# Patient Record
Sex: Female | Born: 1955 | Race: White | Hispanic: No | Marital: Married | Smoking: Never smoker
Health system: Southern US, Community
[De-identification: ages and names within clinical notes are randomized; demographics above are authoritative.]

## PROBLEM LIST (undated history)

## (undated) DIAGNOSIS — R519 Headache, unspecified: Secondary | ICD-10-CM

## (undated) DIAGNOSIS — K56609 Unspecified intestinal obstruction, unspecified as to partial versus complete obstruction: Secondary | ICD-10-CM

## (undated) DIAGNOSIS — Z789 Other specified health status: Secondary | ICD-10-CM

## (undated) HISTORY — DX: Headache, unspecified: R51.9

---

## 1978-11-06 HISTORY — PX: FOOT SURGERY: SHX648

## 1978-11-06 HISTORY — PX: RECONSTRUCTION OF NOSE: SHX2301

## 1998-06-18 ENCOUNTER — Ambulatory Visit (HOSPITAL_BASED_OUTPATIENT_CLINIC_OR_DEPARTMENT_OTHER): Admission: RE | Admit: 1998-06-18 | Discharge: 1998-06-18 | Payer: Self-pay | Admitting: Orthopedic Surgery

## 1998-06-23 ENCOUNTER — Other Ambulatory Visit: Admission: RE | Admit: 1998-06-23 | Discharge: 1998-06-23 | Payer: Self-pay | Admitting: Obstetrics and Gynecology

## 1999-06-23 ENCOUNTER — Other Ambulatory Visit: Admission: RE | Admit: 1999-06-23 | Discharge: 1999-06-23 | Payer: Self-pay | Admitting: Obstetrics and Gynecology

## 2001-07-01 ENCOUNTER — Other Ambulatory Visit: Admission: RE | Admit: 2001-07-01 | Discharge: 2001-07-01 | Payer: Self-pay | Admitting: Obstetrics and Gynecology

## 2001-11-30 ENCOUNTER — Encounter: Payer: Self-pay | Admitting: Surgery

## 2001-11-30 ENCOUNTER — Inpatient Hospital Stay (HOSPITAL_COMMUNITY): Admission: EM | Admit: 2001-11-30 | Discharge: 2001-12-07 | Payer: Self-pay | Admitting: Emergency Medicine

## 2001-12-01 ENCOUNTER — Encounter: Payer: Self-pay | Admitting: Surgery

## 2002-11-06 HISTORY — PX: ABDOMINAL ADHESION SURGERY: SHX90

## 2003-02-06 ENCOUNTER — Other Ambulatory Visit: Admission: RE | Admit: 2003-02-06 | Discharge: 2003-02-06 | Payer: Self-pay | Admitting: Obstetrics and Gynecology

## 2004-04-15 ENCOUNTER — Other Ambulatory Visit: Admission: RE | Admit: 2004-04-15 | Discharge: 2004-04-15 | Payer: Self-pay | Admitting: Obstetrics and Gynecology

## 2006-01-31 ENCOUNTER — Other Ambulatory Visit: Admission: RE | Admit: 2006-01-31 | Discharge: 2006-01-31 | Payer: Self-pay | Admitting: Obstetrics & Gynecology

## 2006-03-07 HISTORY — PX: COLONOSCOPY: SHX174

## 2006-03-07 LAB — HM COLONOSCOPY

## 2007-06-06 ENCOUNTER — Other Ambulatory Visit: Admission: RE | Admit: 2007-06-06 | Discharge: 2007-06-06 | Payer: Self-pay | Admitting: Obstetrics and Gynecology

## 2008-07-09 ENCOUNTER — Other Ambulatory Visit: Admission: RE | Admit: 2008-07-09 | Discharge: 2008-07-09 | Payer: Self-pay | Admitting: Obstetrics and Gynecology

## 2010-04-07 ENCOUNTER — Ambulatory Visit: Payer: Self-pay | Admitting: Physician Assistant

## 2010-04-11 ENCOUNTER — Ambulatory Visit: Payer: Self-pay | Admitting: Family Medicine

## 2010-04-21 ENCOUNTER — Ambulatory Visit: Payer: Self-pay | Admitting: Physician Assistant

## 2011-03-24 NOTE — Discharge Summary (Signed)
[facility]  Patient:    DAY, GREB Visit Number: [id number] MRN: [id number]          Service Type: SUR Location: 4W 0449 01 Attending Physician:  Charlton Haws Dictated by:   Currie Paris, M.D. Admit Date:  11/30/2001 Discharge Date: 12/07/2001                             Discharge Summary  VISIT #[id number]  FINAL DIAGNOSES: 1. Acute small-bowel obstruction. 2. Hypokalemia.  HOSPITAL COURSE:  Ms. Levings is a generally healthy 55 year old lady who was admitted on November 30, 2001, with what appeared to be an early small-bowel obstruction, but she was also a little bit hypokalemic.  The next morning a follow-up x-ray showed no improvement in continued pattern of small-bowel obstruction.  Her potassium was up to 3.2.  She was taken to the operating room on December 01, 2001, where a closed-loop small-bowel obstruction was found secondary to an adhesion from prior surgery.  However, there was no compromise of the bowel, and she underwent cholecystectomy.  Postoperatively she basically did well.  She had NG in for a couple of days and then was able to have it removed.  By the third postoperative day she was progressing, her staples were removed, and Dulcolax given.  On the fourth postoperative day she was advanced a little bit on her diet.  This was gradually advanced until December 07, 2001, when she was able to be discharged.  Her wound looked okay, but she had a little clear wound drainage.  There was no abscess encountered. She was discharged with Tylox for pain.  No strenuous activity.  See me in the office in about two weeks.  LABORATORY DATA:  Initial laboratory studies included a hemoglobin of 12, white count of 11,600.  Urine was basically clear, although she had some white cells and red cells on initial, and on repeat that was negative.  EKG showed a little left atrial enlargement but otherwise negative.  PA and lateral  chest x-ray were negative. Dictated by:   Currie Paris, M.D. Attending Physician:  Charlton Haws DD:  12/14/01 TD:  12/15/01 Job: [id number]

## 2011-03-24 NOTE — H&P (Signed)
[facility]  Patient:    Anne Kelly, Anne Kelly Visit Number: [id number] MRN: [id number]          Service Type: SUR Location: 4W 0449 01 Attending Physician:  Charlton Haws Dictated by:   Currie Paris, M.D. Admit Date:  11/30/2001   CC:         [facility]   History and Physical  VISIT NUMBER:  [id number]  CHIEF COMPLAINT:  Abdominal pain.  CLINICAL HISTORY:  Anne Kelly is a 55 year old lady who was in her usual state of good health until Wednesday before admission (today is Saturday) when she developed some abdominal pain and nausea.  She vomited one time.  She continued to have discomfort through her upper abdomen all day Thursday and Friday.  She really was not keeping anything down, trying just to do sips of liquids, but throwing up most of that as well.  She has not had a bowel movement nor passed gas for the last 48-72 hours either.  Because of her ongoing symptoms, she was seen in the [facility].  X-rays there suggested SBO and I was asked to see her in the emergency room.  The patient has never had any similar problems.  She has not passed any blood through her stools nor has she thrown up any blood.  She has never had any similar symptoms.  Her only prior abdominal procedure was an appendectomy done when she was in grade school.  PAST SURGICAL HISTORY:  Appendectomy.  She has also had nasal surgery and orthopedic surgery from athletic injuries.  MEDICATIONS:  None.  ALLERGIES:  CODEINE causes nausea.  HABITS:  Smoking:  None.  Alcohol:  Occasional.  FAMILY HISTORY:  Unremarkable.  REVIEW OF SYSTEMS:  HEENT:  Negative.  Chest:  No cough or shortness of breath.  Heart:  No history of murmurs or hypertension.  Abdomen:  Negative. GU:  Negative.  Extremities:  Negative.  PHYSICAL EXAMINATION:  The patient is a 55 year old, healthy-appearing lady who is uncomfortable.  HEENT:  The head  is normocephalic.  Eyes nonicteric.  Pupils round and regular.  Mucous membranes are noted to be dry.  NECK:  Supple.  There are no masses or thyromegaly.  LUNGS:  Clear to auscultation.  HEART:  Regular.  No murmurs, rubs, or gallops are heard.  ABDOMEN:  A little distended.  It is soft.  Diffusely somewhat tender to direct palpation, but there is no rebound.  Bowel sounds are intermittent high pitched followed by periods of silence.  RECTAL:  Exam not done.  EXTREMITIES:  No cyanosis or edema.  LABORATORY DATA:  Laboratory studies are pending.  I reviewed the KUB and upright from [facility] and this does appear to be small bowel obstruction with multiple loops of distended small bowel with some air/fluid levels.  There is a little stool in the right colon, but the colon is basically decompressed.  IMPRESSION: 1. Small bowel obstruction probably secondary to adhesions. 2. Volume depletion.  PLAN:  Awaiting labs.  Will go ahead and try to rehydrate her.  Will place an NG to see if this will give her a little comfort.  Will make a decision after further evaluation as to whether she can be treated non-operatively for a period of 24 hours or so or whether we will need to proceed with surgical intervention today.  I discussed all of this with the patient and her husband. They understand the plans. Dictated  by:   Currie Paris, M.D. Attending Physician:  Charlton Haws DD:  11/30/01 TD:  11/30/01 Job: [id number]

## 2011-03-24 NOTE — Op Note (Signed)
[facility]  Patient:    Anne Kelly, Anne Kelly Visit Number: [id number] MRN: [id number]          Service Type: SUR Location: 4W 0449 01 Attending Physician:  Charlton Haws Dictated by:   Currie Paris, M.D. Proc. Date: 12/01/01 Admit Date:  11/30/2001                             Operative Report  VISIT #[id number].  PREOPERATIVE DIAGNOSIS:  Small bowel obstruction secondary to adhesions.  POSTOPERATIVE DIAGNOSIS:  Small bowel obstruction secondary to adhesions, with closed loop obstruction.  OPERATION: 1. Exploratory laparotomy. 2. Lysis of adhesions.  SURGEON:  Currie Paris, M.D.  ASSISTANT:  Sandria Bales. Ezzard Standing, M.D.  ANESTHESIA:  General endotracheal.  CLINICAL HISTORY:  This patient is a 55 year old who presented yesterday, January 25, with what appeared to be a small bowel obstruction. She was maintained on IV fluids and NG suction overnight and followup films this morning showed no improvement, and in fact, increased dilation of the proximal small bowel. She symptomatically did pass any gas and was not feeling any better. The decision was made to proceed to exploratory laparotomy with the presumptive diagnosis of bowel obstruction secondary to adhesions from her prior appendectomy.  DESCRIPTION OF PROCEDURE:  The patient was brought to the operating room and after satisfactory general endotracheal anesthesia had been obtained, the abdomen was prepped and draped. A short midline incision was made below the umbilicus down towards the pubis and fascia opened with a cautery, the peritoneum picked up, and the peritoneal cavity entered. As I began to manipulate the small bowel out, there was some distal small bowel and proximal dilated distal _______ . I reached my hand in and pulled down on some dilated bowel which appeared to be the cecum and I found a loop of bowel pull out from underneath where there was what appeared to be a  band coming from the omentum down to the cecum. This was divided and I believe this represented the bowel obstruction. As I ran the bowel backwards from the cecum, we got to an area where the bowel had clearly been compressed crossways and I believe this was the loop that I pulled out as I was manipulating the colon out. All the bowel proximal to this was markedly dilated distal to this relatively empty but there was no flow through this area with the two transverse marks going across the bowel about 8 inches apart indicating we had a rather short loop in the closed loop. The bowel, however, was all viable. I pulled some more of the small bowel out proximally to make sure there were no other problems, but this short incision precluded me from milking any of its contents back up to be suctioned out the NG tube.  After we checked to make sure everything was dry, I went ahead and closed using a running PDS on the peritoneum and a #1 PDS on the fascia. The wound was irrigated and closed with staples and Steri-Strips. The patient tolerated the procedure well. There were no operative complications. All counts were correct. Dictated by:   Currie Paris, M.D. Attending Physician:  Charlton Haws DD:  12/01/01 TD:  12/02/01 Job: [id number]

## 2011-11-17 ENCOUNTER — Encounter: Payer: Self-pay | Admitting: Internal Medicine

## 2012-04-04 ENCOUNTER — Encounter (HOSPITAL_COMMUNITY): Payer: Self-pay | Admitting: Pharmacist

## 2012-04-05 DIAGNOSIS — N950 Postmenopausal bleeding: Secondary | ICD-10-CM | POA: Diagnosis present

## 2012-04-05 DIAGNOSIS — D250 Submucous leiomyoma of uterus: Secondary | ICD-10-CM | POA: Diagnosis present

## 2012-04-06 HISTORY — PX: HYSTEROSCOPY: SHX211

## 2012-04-07 MED ORDER — METRONIDAZOLE IN NACL 5-0.79 MG/ML-% IV SOLN
500.0000 mg | INTRAVENOUS | Status: AC
  Administered 2012-04-08: 500 mg via INTRAVENOUS
  Filled 2012-04-07: qty 100

## 2012-04-07 NOTE — H&P (Signed)
Anne Kelly is an 56 y.o. female with post menopausal bleeding.  She is not on HRT.  Her bleeding was evaluated with an office ultrasound and endometrial biopsy.  Biopsy showed polypoid proliferative endometrium.  Ultrasound showed several fibroids and a submucosal 10mm fibroid.  Options for treatment have been discussed with patient and spouse.  Pertinent Gynecological History: Menses: post-menopausal Bleeding: post menopausal bleeding Contraception: none DES exposure: denies Blood transfusions: none Sexually transmitted diseases: no past history Previous GYN Procedures: none  Last mammogram: normal Date: 5/13 Last pap: normal Date: 9/11 OB History: G3, P3   Menstrual History: Menarche age: 45 No LMP recorded.    Past Medical History  Diagnosis Date  . Allergic rhinitis     No past surgical history on file.  No family history on file.  Social History:  does not have a smoking history on file. She does not have any smokeless tobacco history on file. Her alcohol and drug histories not on file.  Allergies:  Allergies  Allergen Reactions  . Amoxicillin Hives and Shortness Of Breath  . Codeine Nausea And Vomiting  . Penicillins Itching    No prescriptions prior to admission    Review of Systems  Constitutional: Negative for fever and chills.  Eyes: Negative for blurred vision.  Respiratory: Negative for cough.   Cardiovascular: Negative for chest pain and palpitations.  Gastrointestinal: Negative for heartburn.  Genitourinary: Negative for dysuria.  Musculoskeletal: Negative for myalgias.  Skin: Negative for rash.  Neurological: Negative for dizziness and headaches.  Endo/Heme/Allergies: Does not bruise/bleed easily.  Psychiatric/Behavioral: Negative for depression.    There were no vitals taken for this visit. Physical Exam  Vitals reviewed. Constitutional: She is oriented to person, place, and time. She appears well-developed and well-nourished.  HENT:    Head: Normocephalic and atraumatic.  Neck: Normal range of motion. Neck supple.  Cardiovascular: Normal rate and regular rhythm.   Respiratory: Effort normal and breath sounds normal.  GI: Soft. Bowel sounds are normal.  Genitourinary:       Enlarged uterus consistent with fibroids  Musculoskeletal: Normal range of motion.  Neurological: She is alert and oriented to person, place, and time.  Skin: Skin is warm and dry.  Psychiatric: She has a normal mood and affect.    No results found for this or any previous visit (from the past 24 hour(s)).  No results found.  Assessment/Plan: 56 yo MWF with post menopausal bleeding and SHG showing a 10 x 10mm submucosal fibroid.  Hysteroscopy, fibroid resection, D&C discussed with the patient.  Risks and benefits have been discussed with patient and are documented in patient's office chart.  She is here and ready to proceed.  We have reviewed the procedure this morning.  All questions answered.  Valentina Shaggy [facility] 04/07/2012, 8:38 PM

## 2012-04-08 ENCOUNTER — Ambulatory Visit (HOSPITAL_COMMUNITY)
Admission: RE | Admit: 2012-04-08 | Discharge: 2012-04-08 | Disposition: A | Discharge status: Home / Self Care | Payer: BC Managed Care – PPO | Source: Ambulatory Visit | Attending: Obstetrics & Gynecology | Admitting: Obstetrics & Gynecology

## 2012-04-08 ENCOUNTER — Encounter (HOSPITAL_COMMUNITY): Payer: Self-pay | Admitting: Anesthesiology

## 2012-04-08 ENCOUNTER — Ambulatory Visit (HOSPITAL_COMMUNITY): Payer: BC Managed Care – PPO | Admitting: Anesthesiology

## 2012-04-08 ENCOUNTER — Encounter (HOSPITAL_COMMUNITY): Payer: Self-pay | Admitting: *Deleted

## 2012-04-08 ENCOUNTER — Encounter (HOSPITAL_COMMUNITY)
Admission: RE | Disposition: A | Discharge status: Home / Self Care | Payer: Self-pay | Source: Ambulatory Visit | Attending: Obstetrics & Gynecology

## 2012-04-08 DIAGNOSIS — N840 Polyp of corpus uteri: Secondary | ICD-10-CM | POA: Insufficient documentation

## 2012-04-08 DIAGNOSIS — N950 Postmenopausal bleeding: Secondary | ICD-10-CM | POA: Diagnosis present

## 2012-04-08 DIAGNOSIS — D250 Submucous leiomyoma of uterus: Secondary | ICD-10-CM | POA: Diagnosis present

## 2012-04-08 HISTORY — DX: Other specified health status: Z78.9

## 2012-04-08 LAB — CBC
HCT: 43.6 % (ref 36.0–46.0)
MCV: 95.8 fL (ref 78.0–100.0)
RBC: 4.55 MIL/uL (ref 3.87–5.11)
RDW: 12.5 % (ref 11.5–15.5)
WBC: 7.3 10*3/uL (ref 4.0–10.5)

## 2012-04-08 SURGERY — DILATATION & CURETTAGE/HYSTEROSCOPY WITH RESECTOCOPE
Anesthesia: General | Site: Vagina | Wound class: Clean Contaminated

## 2012-04-08 MED ORDER — FENTANYL CITRATE 0.05 MG/ML IJ SOLN
INTRAMUSCULAR | Status: AC
  Filled 2012-04-08: qty 5

## 2012-04-08 MED ORDER — LIDOCAINE-EPINEPHRINE 1 %-1:100000 IJ SOLN
INTRAMUSCULAR | Status: DC | PRN
  Administered 2012-04-08: 10 mL

## 2012-04-08 MED ORDER — FENTANYL CITRATE 0.05 MG/ML IJ SOLN: 25.0000 ug | INTRAMUSCULAR | Status: DC | PRN

## 2012-04-08 MED ORDER — MIDAZOLAM HCL 2 MG/2ML IJ SOLN: 0.5000 mg | Freq: Once | INTRAMUSCULAR | Status: DC | PRN

## 2012-04-08 MED ORDER — SILVER NITRATE-POT NITRATE 75-25 % EX MISC
CUTANEOUS | Status: AC
  Filled 2012-04-08: qty 1

## 2012-04-08 MED ORDER — PROPOFOL 10 MG/ML IV EMUL
INTRAVENOUS | Status: DC | PRN
  Administered 2012-04-08: 160 mg via INTRAVENOUS

## 2012-04-08 MED ORDER — MIDAZOLAM HCL 5 MG/5ML IJ SOLN
INTRAMUSCULAR | Status: DC | PRN
  Administered 2012-04-08: 2 mg via INTRAVENOUS

## 2012-04-08 MED ORDER — MEPERIDINE HCL 25 MG/ML IJ SOLN: 6.2500 mg | INTRAMUSCULAR | Status: DC | PRN

## 2012-04-08 MED ORDER — ONDANSETRON HCL 4 MG/2ML IJ SOLN
INTRAMUSCULAR | Status: DC | PRN
  Administered 2012-04-08: 4 mg via INTRAVENOUS

## 2012-04-08 MED ORDER — PROPOFOL 10 MG/ML IV EMUL
INTRAVENOUS | Status: AC
  Filled 2012-04-08: qty 20

## 2012-04-08 MED ORDER — ACETAMINOPHEN 325 MG PO TABS: 325.0000 mg | ORAL_TABLET | ORAL | Status: DC | PRN

## 2012-04-08 MED ORDER — LACTATED RINGERS IV SOLN
INTRAVENOUS | Status: DC
  Administered 2012-04-08 (×2): via INTRAVENOUS

## 2012-04-08 MED ORDER — LIDOCAINE HCL (CARDIAC) 20 MG/ML IV SOLN
INTRAVENOUS | Status: AC
  Filled 2012-04-08: qty 5

## 2012-04-08 MED ORDER — PROMETHAZINE HCL 25 MG/ML IJ SOLN: 6.2500 mg | INTRAMUSCULAR | Status: DC | PRN

## 2012-04-08 MED ORDER — KETOROLAC TROMETHAMINE 30 MG/ML IJ SOLN
INTRAMUSCULAR | Status: AC
  Filled 2012-04-08: qty 1

## 2012-04-08 MED ORDER — KETOROLAC TROMETHAMINE 60 MG/2ML IM SOLN
INTRAMUSCULAR | Status: AC
  Filled 2012-04-08: qty 2

## 2012-04-08 MED ORDER — HYDROCODONE-ACETAMINOPHEN 5-500 MG PO TABS: 2.0000 | ORAL_TABLET | Freq: Four times a day (QID) | ORAL | Status: AC | PRN

## 2012-04-08 MED ORDER — KETOROLAC TROMETHAMINE 30 MG/ML IJ SOLN: 15.0000 mg | Freq: Once | INTRAMUSCULAR | Status: DC | PRN

## 2012-04-08 MED ORDER — GLYCINE 1.5 % IR SOLN
Status: DC | PRN
  Administered 2012-04-08: 3000 mL
  Administered 2012-04-08: 2

## 2012-04-08 MED ORDER — KETOROLAC TROMETHAMINE 30 MG/ML IJ SOLN
INTRAMUSCULAR | Status: DC | PRN
  Administered 2012-04-08: 60 mg via INTRAVENOUS

## 2012-04-08 MED ORDER — ONDANSETRON HCL 4 MG/2ML IJ SOLN
INTRAMUSCULAR | Status: AC
  Filled 2012-04-08: qty 2

## 2012-04-08 MED ORDER — LIDOCAINE HCL (CARDIAC) 20 MG/ML IV SOLN
INTRAVENOUS | Status: DC | PRN
  Administered 2012-04-08 (×2): 30 mg via INTRAVENOUS

## 2012-04-08 MED ORDER — FENTANYL CITRATE 0.05 MG/ML IJ SOLN
INTRAMUSCULAR | Status: DC | PRN
  Administered 2012-04-08 (×3): 50 ug via INTRAVENOUS

## 2012-04-08 MED ORDER — MIDAZOLAM HCL 2 MG/2ML IJ SOLN
INTRAMUSCULAR | Status: AC
  Filled 2012-04-08: qty 2

## 2012-04-08 SURGICAL SUPPLY — 16 items
CANISTER SUCTION 2500CC (MISCELLANEOUS) ×2 IMPLANT
CATH ROBINSON RED A/P 16FR (CATHETERS) ×2 IMPLANT
CLOTH BEACON ORANGE TIMEOUT ST (SAFETY) ×2 IMPLANT
CONTAINER PREFILL 10% NBF 60ML (FORM) ×4 IMPLANT
DILATOR CANAL MILEX (MISCELLANEOUS) IMPLANT
ELECT REM PT RETURN 9FT ADLT (ELECTROSURGICAL) ×2
ELECTRODE REM PT RTRN 9FT ADLT (ELECTROSURGICAL) IMPLANT
GLOVE BIOGEL PI IND STRL 7.0 (GLOVE) ×1 IMPLANT
GLOVE BIOGEL PI INDICATOR 7.0 (GLOVE) ×1
GLOVE ECLIPSE 6.5 STRL STRAW (GLOVE) ×4 IMPLANT
GOWN PREVENTION PLUS LG XLONG (DISPOSABLE) ×2 IMPLANT
GOWN STRL REIN XL XLG (GOWN DISPOSABLE) ×2 IMPLANT
LOOP ANGLED CUTTING 22FR (CUTTING LOOP) ×1 IMPLANT
PACK HYSTEROSCOPY LF (CUSTOM PROCEDURE TRAY) ×2 IMPLANT
TOWEL OR 17X24 6PK STRL BLUE (TOWEL DISPOSABLE) ×4 IMPLANT
WATER STERILE IRR 1000ML POUR (IV SOLUTION) ×2 IMPLANT

## 2012-04-08 NOTE — Transfer of Care (Signed)
Immediate Anesthesia Transfer of Care Note  Patient: Anne Kelly  Procedure(s) Performed: Procedure(s) (LRB): DILATATION & CURETTAGE/HYSTEROSCOPY WITH RESECTOCOPE (N/A)  Patient Location: PACU  Anesthesia Type: General  Level of Consciousness: awake, alert  and oriented  Airway & Oxygen Therapy: Patient Spontanous Breathing and Patient connected to nasal cannula oxygen  Post-op Assessment: Post -op Vital signs reviewed and stable, Patient moving all extremities and Patient moving all extremities X 4  Post vital signs: Reviewed and stable  Complications: No apparent anesthesia complications

## 2012-04-08 NOTE — Progress Notes (Signed)
Pt with moderate vaginal bleeding, Dr Hyacinth Meeker by to see pt. Discussed bleeding, changed pad, Dr. Hyacinth Meeker to come back in 30-45 minutes for followup

## 2012-04-08 NOTE — Anesthesia Postprocedure Evaluation (Signed)
  Anesthesia Post-op Note  Patient: Anne Kelly  Procedure(s) Performed: Procedure(s) (LRB): DILATATION & CURETTAGE/HYSTEROSCOPY WITH RESECTOCOPE (N/A)  Patient is awake and responsive. Pain and nausea are reasonably well controlled. Vital signs are stable and clinically acceptable. Oxygen saturation is clinically acceptable. There are no apparent anesthetic complications at this time. Patient is ready for discharge.

## 2012-04-08 NOTE — Anesthesia Preprocedure Evaluation (Signed)
Anesthesia Evaluation  Patient identified by MRN, date of birth, ID band Patient awake    Reviewed: Allergy & Precautions, H&P , Patient's Chart, lab work & pertinent test results, reviewed documented beta blocker date and time   History of Anesthesia Complications Negative for: history of anesthetic complications  Airway Mallampati: II TM Distance: >3 FB Neck ROM: full    Dental No notable dental hx.    Pulmonary neg pulmonary ROS,  breath sounds clear to auscultation  Pulmonary exam normal       Cardiovascular Exercise Tolerance: Good negative cardio ROS  Rhythm:regular Rate:Normal     Neuro/Psych negative neurological ROS  negative psych ROS   GI/Hepatic negative GI ROS, Neg liver ROS,   Endo/Other  negative endocrine ROS  Renal/GU negative Renal ROS     Musculoskeletal   Abdominal   Peds  Hematology negative hematology ROS (+)   Anesthesia Other Findings   Reproductive/Obstetrics negative OB ROS                           Anesthesia Physical Anesthesia Plan  ASA: I  Anesthesia Plan: General LMA   Post-op Pain Management:    Induction:   Airway Management Planned:   Additional Equipment:   Intra-op Plan:   Post-operative Plan:   Informed Consent: I have reviewed the patients History and Physical, chart, labs and discussed the procedure including the risks, benefits and alternatives for the proposed anesthesia with the patient or authorized representative who has indicated his/her understanding and acceptance.   Dental Advisory Given  Plan Discussed with: CRNA, Surgeon and Anesthesiologist  Anesthesia Plan Comments:         Anesthesia Quick Evaluation  

## 2012-04-08 NOTE — Discharge Instructions (Signed)
Post-surgical Instructions, Outpatient Surgery  You may expect to feel dizzy, weak, and drowsy for as long as 24 hours after receiving the medicine that made you sleep (anesthetic). For the first 24 hours after your surgery:    Do not drive a car, ride a bicycle, participate in physical activities, or take public transportation until you are done taking narcotic pain medicines or as directed by Dr. Hyacinth Meeker.   Do not drink alcohol or take tranquilizers.   Do not take medicine that has not been prescribed by your physicians.   Do not sign important papers or make important decisions while on narcotic pain medicines.   Have a responsible person with you.   PAIN MANAGEMENT  Motrin 800mg .  (This is the same as 4-200mg  over the counter tablets of Motrin or ibuprofen.)  You may take this every eight hours or as needed for cramping.    Vicodin 5/500mg .  For more severe pain, take one or two tablets every four to six hours as needed for pain control.  (Remember that narcotic pain medications increase your risk of constipation.  If this becomes a problem, you may take an over the counter stool softener like Colace 100mg  up to four times a day.)  DO'S AND DON'T'S  Do not take a tub bath for one week.  You may shower on the first day after your surgery  Do not do any heavy lifting for one to two weeks.  This increases the chance of bleeding.  Do move around as you feel able.  Stairs are fine.  You may begin to exercise again as you feel able.  Do not lift any weights for two weeks.  Do not put anything in the vagina for two weeks--no tampons, intercourse, or douching.    REGULAR MEDIATIONS/VITAMINS:  You may restart all of your regular medications as prescribed.   You may restart all of your vitamins as you normally take them.  You may want to wait until you are having regular bowel movements before restarting all of your vitamins.    PLEASE CALL OR SEEK MEDICAL CARE IF:  You have persistent  nausea and vomiting.   You have trouble eating or drinking.   You have an oral temperature above 100.5.   You have constipation that is not helped by adjusting diet or increasing fluid intake. Pain medicines are a common cause of constipation.   You have heavy vaginal bleeding.  Spotting and light bleeding are normal.  Please expect this.  You have any unusual or foul smelling vaginal discharge.

## 2012-04-08 NOTE — Op Note (Signed)
04/08/2012  8:34 AM  PATIENT:  Anne Kelly  56 y.o. female with postmenopausal bleeding, submucosal fibroid noted on SHG  PRE-OPERATIVE DIAGNOSIS:  PMB, submucosal fibroid  POST-OPERATIVE DIAGNOSIS:  post menopausal bleeding, submucosal fibroid, possible endometrial polyps vs polypoid appearing tissue   PROCEDURE:  Procedure(s): DILATATION & CURETTAGE/HYSTEROSCOPY WITH FIBROID RESECTION USING THE RESECTOCOPE  SURGEON:  Taelon Bendorf SUZANNE  ASSISTANTS: OR staff   ANESTHESIA:   general  ESTIMATED BLOOD LOSS: * No blood loss amount entered *  BLOOD ADMINISTERED:none   FLUIDS: 1100cc LR  UOP: 50cc drained at beginning of case with I&O cath  SPECIMEN:  Endometrial curettings and submucosal fibroid  DISPOSITION OF SPECIMEN:  PATHOLOGY  FINDINGS: fluffy endometrial tissue that appears to contain several polyps vs just appearing polypoid, left fundal submucosal fibroid  DESCRIPTION OF OPERATION: Patient was taken the operating room. She is placed in the supine position. Anesthesia was administered by the anesthesia staff without difficulty. Dr. Cristela Blue oversaw procedure. An LMA was used for her anesthesia during the procedure. Once adequate anesthesia was confirmed, the patient's legs are placed in the low lithotomy position in [place] stirrups. SCDs were on her lower extremities and functioning properly. A timeout was performed. The patient was prepped with Betadine prep on the inner thighs perineum and vagina x3. A red rubber Foley catheter was used to drain the bladder of all urine. Patient was then draped in a normal standard fashion. Legs are lifted to the high lithotomy position.  A bivalve speculums placed in the vagina. The anterior lip of the cervix was grasped with single-tooth tenaculum. A paracervical block of 1% Xylocaine mixed one-to-one with epinephrine (1:100,000 units) was used. 10 cc total was used for the paracervical block.  The uterus sounded to 10 cm. The cervix  was dilated up to a #21 using Pratt dilators. Then a 3.9 diagnostic hysteroscope was used. The endometrial tissue was very fluffy at first it was quite difficult to see the fibroid because of the volume of thickened endometrial tissue. This will ostia on the right was noted. Photodocumentation was made. Decision was made to go ahead and do a curetting at this point. Hysteroscope was removed and a #1 smooth and #1 toothed curette were used alternately to the thin endometrium. A rough gritty texture was noted in all quadrants except for the left fundal region which felt smooth due to the fibroid. The hysteroscope was then re\re placed into the endometrial cavity and the fibroid was clearly visualized this point.  The cervix was dilated up to a #25 with [facility] dilator. The resectoscope with a single loop attached was then passed and endometrial cavity to the fundus of the uterus. The fibroid was resected with multiple passes of the loop until fibroid was smoothed with the endometrial cavity. The specimen was absent endometrial curettings and sent together. The base of the fibroid resection was then cauterized using the loop only all cautery setting. At this point there was minimal bleeding noted and the hysteroscope was removed. The hysteroscopic fluid used was 1.5% glycine and there was a 455 cc deficit however at least 100 cc was on the floor. During part of the procedure the fluid connector tube came unattached from the hysteroscope and lost about 100 cc at that time. The actual deficit for the case is somewhere near 350 cc.  The single-tooth tenaculum was removed from the anterior lip of the cervix. Silver nitrate was used to achieve hemostasis at one of the tenaculum sites. The bivalve  speculum was removed. The Betadine prep was cleansed of the patient's skin and her legs were moved back down to the supine position. Sponge, laps, needles, initially counts were correct x2. The patient was given 30 mg IV and IM of  Toradol.  She was and taken to the recovery room in stable condition.  COUNTS:  YES  PLAN OF CARE: Transfer to PACU

## 2013-01-30 ENCOUNTER — Other Ambulatory Visit: Payer: Self-pay | Admitting: Orthopedic Surgery

## 2013-01-30 ENCOUNTER — Telehealth: Payer: Self-pay | Admitting: Obstetrics & Gynecology

## 2013-01-30 ENCOUNTER — Other Ambulatory Visit: Payer: Self-pay | Admitting: Nurse Practitioner

## 2013-01-30 DIAGNOSIS — Z23 Encounter for immunization: Secondary | ICD-10-CM

## 2013-01-30 NOTE — Telephone Encounter (Signed)
SEE AMY NOTE. OK FOR PATIENT TO HAVE TDAP? IS SO NEED TO PUT IN ORDER.  AMY/SUE PLEASE ADVISE

## 2013-01-30 NOTE — Telephone Encounter (Signed)
Kennon Rounds and I placed the order in the chart Thanks, PG

## 2013-01-31 NOTE — Telephone Encounter (Signed)
Spoke with pt who is expecting a grandchild soon and wishes to get TDaP vaccine. Advised pt that PG gave the order for her to have it. Offered nurse visit to give it, but pt thinks it would be easier for her to go to the [facility] that is next door to her, as her husband needs one as well. Pt to get it over the weekend at [facility]. Pt will call back Monday if unable to get it there.  aa

## 2013-07-29 ENCOUNTER — Encounter: Payer: Self-pay | Admitting: Nurse Practitioner

## 2013-07-29 ENCOUNTER — Ambulatory Visit (INDEPENDENT_AMBULATORY_CARE_PROVIDER_SITE_OTHER): Payer: BC Managed Care – PPO | Admitting: Nurse Practitioner

## 2013-07-29 VITALS — BP 120/84 | HR 64 | Resp 16 | Ht 68.25 in | Wt 162.0 lb

## 2013-07-29 DIAGNOSIS — E559 Vitamin D deficiency, unspecified: Secondary | ICD-10-CM

## 2013-07-29 DIAGNOSIS — Z0000 Encounter for general adult medical examination without abnormal findings: Secondary | ICD-10-CM

## 2013-07-29 DIAGNOSIS — Z01419 Encounter for gynecological examination (general) (routine) without abnormal findings: Secondary | ICD-10-CM

## 2013-07-29 LAB — POCT URINALYSIS DIPSTICK
Leukocytes, UA: NEGATIVE
Nitrite, UA: NEGATIVE
Protein, UA: NEGATIVE
Urobilinogen, UA: NEGATIVE
pH, UA: 5

## 2013-07-29 MED ORDER — VITAMIN D (ERGOCALCIFEROL) 1.25 MG (50000 UNIT) PO CAPS: 50000.0000 [IU] | ORAL_CAPSULE | ORAL | Status: DC

## 2013-07-29 NOTE — Patient Instructions (Signed)

## 2013-07-29 NOTE — Progress Notes (Signed)
Patient ID: Anne Kelly, female   DOB: [birth year], 57 y.o.   MRN: [id number] 57 y.o. G3P3003 Married Caucasian Fe here for annual exam.  No new health concerns. No further PMB.  Father passed away last 10/03/23 - he had been in [facility] for some time.  No LMP recorded. Patient is postmenopausal.          Sexually active: yes  The current method of family planning is post menopausal status.    Exercising: yes  Home exercise routine includes walking everyday and [place], [place] lake. Smoker:  no  Health Maintenance: Pap:  07/23/12 WNL, neg HR HPV MMG:  03/17/13, BI-Rads 2: benign findings Colonoscopy:  03/07/06, repeat in 10 years TDaP:  03/2013 Labs: HB: 15.4 Urine: negative   reports that she has never smoked. She has never used smokeless tobacco. She reports that she drinks about 1.8 ounces of alcohol per week. She reports that she does not use illicit drugs.  Past Medical History  Diagnosis Date  . Allergic rhinitis   . No pertinent past medical history     Past Surgical History  Procedure Laterality Date  . Abdominal adhesion surgery  2004  . Reconstruction of nose  1980  . Vaginal delivery  1982, 1984, 1986    Current Outpatient Prescriptions  Medication Sig Dispense Refill  . fish oil-omega-3 fatty acids 1000 MG capsule Take 2 g by mouth daily.      . Multiple Vitamins-Minerals (MULTIVITAMIN WITH MINERALS) tablet Take 1 tablet by mouth daily.       No current facility-administered medications for this visit.    No family history on file.  ROS:  Pertinent items are noted in HPI.  Otherwise, a comprehensive ROS was negative.  Exam:   BP 120/84  Pulse 64  Resp 16  Ht 5' 8.25" (1.734 m)  Wt 162 lb (73.483 kg)  BMI 24.44 kg/m2 Height: 5' 8.25" (173.4 cm)  Ht Readings from Last 3 Encounters:  07/29/13 5' 8.25" (1.734 m)  04/08/12 5' 8.5" (1.74 m)  04/08/12 5' 8.5" (1.74 m)    General appearance: alert,  cooperative and appears stated age Head: Normocephalic, without obvious abnormality, atraumatic Neck: no adenopathy, supple, symmetrical, trachea midline and thyroid normal to inspection and palpation Lungs: clear to auscultation bilaterally Breasts: normal appearance, no masses or tenderness Heart: regular rate and rhythm Abdomen: soft, non-tender; no masses,  no organomegaly Extremities: extremities normal, atraumatic, no cyanosis or edema Skin: Skin color, texture, turgor normal. No rashes or lesions Lymph nodes: Cervical, supraclavicular, and axillary nodes normal. No abnormal inguinal nodes palpated Neurologic: Grossly normal   Pelvic: External genitalia:  no lesions              Urethra:  normal appearing urethra with no masses, tenderness or lesions              Bartholin's and Skene's: normal                 Vagina: normal appearing vagina with normal color and discharge, no lesions              Cervix: anteverted              Pap taken: no Bimanual Exam:  Uterus:  normal size, contour, position, consistency, mobility, non-tender              Adnexa: no mass, fullness, tenderness  Rectovaginal: Confirms               Anus:  normal sphincter tone, no lesions  A:  Well Woman with normal exam  Postmenopausal - no HRT  History of Vit D deficiency  History of PMB with Hysteroscopic resection of UTE fibroids 04/08/12 without further vaginal bleeding  P:   Pap smear as per guidelines Not done today  Mammogram due 03/2014  Refill Vit D - dose per pending labs.  Will return for labs fasting  Counseled on breast self exam, adequate intake of calcium and vitamin D, diet and exercise return annually or prn  An After Visit Summary was printed and given to the patient.

## 2013-08-04 NOTE — Progress Notes (Signed)
Encounter reviewed by Dr. Carleen Rhue Silva.  

## 2013-08-05 ENCOUNTER — Other Ambulatory Visit (INDEPENDENT_AMBULATORY_CARE_PROVIDER_SITE_OTHER): Payer: BC Managed Care – PPO

## 2013-08-05 DIAGNOSIS — Z0000 Encounter for general adult medical examination without abnormal findings: Secondary | ICD-10-CM

## 2013-08-05 LAB — LIPID PANEL
LDL Cholesterol: 158 mg/dL — ABNORMAL HIGH (ref 0–99)
VLDL: 17 mg/dL (ref 0–40)

## 2013-08-05 LAB — COMPREHENSIVE METABOLIC PANEL
ALT: 25 U/L (ref 0–35)
AST: 23 U/L (ref 0–37)
Albumin: 4.2 g/dL (ref 3.5–5.2)
Alkaline Phosphatase: 60 U/L (ref 39–117)
BUN: 11 mg/dL (ref 6–23)
Potassium: 4.6 mEq/L (ref 3.5–5.3)
Sodium: 139 mEq/L (ref 135–145)
Total Protein: 6.5 g/dL (ref 6.0–8.3)

## 2013-08-05 LAB — TSH: TSH: 1.153 u[IU]/mL (ref 0.350–4.500)

## 2013-09-11 ENCOUNTER — Other Ambulatory Visit: Payer: Self-pay

## 2014-08-21 ENCOUNTER — Other Ambulatory Visit: Payer: Self-pay

## 2014-09-07 ENCOUNTER — Encounter: Payer: Self-pay | Admitting: Nurse Practitioner

## 2014-11-10 ENCOUNTER — Ambulatory Visit: Payer: BC Managed Care – PPO | Admitting: Nurse Practitioner

## 2015-01-05 ENCOUNTER — Ambulatory Visit (INDEPENDENT_AMBULATORY_CARE_PROVIDER_SITE_OTHER): Payer: BLUE CROSS/BLUE SHIELD | Admitting: Nurse Practitioner

## 2015-01-05 ENCOUNTER — Encounter: Payer: Self-pay | Admitting: Nurse Practitioner

## 2015-01-05 ENCOUNTER — Other Ambulatory Visit: Payer: Self-pay | Admitting: Nurse Practitioner

## 2015-01-05 VITALS — BP 110/74 | HR 60 | Ht 68.25 in | Wt 169.0 lb

## 2015-01-05 DIAGNOSIS — Z0000 Encounter for general adult medical examination without abnormal findings: Secondary | ICD-10-CM | POA: Diagnosis not present

## 2015-01-05 DIAGNOSIS — Z1211 Encounter for screening for malignant neoplasm of colon: Secondary | ICD-10-CM | POA: Diagnosis not present

## 2015-01-05 DIAGNOSIS — Z01419 Encounter for gynecological examination (general) (routine) without abnormal findings: Secondary | ICD-10-CM | POA: Diagnosis not present

## 2015-01-05 LAB — POCT URINALYSIS DIPSTICK
Bilirubin, UA: NEGATIVE
Glucose, UA: NEGATIVE
Ketones, UA: NEGATIVE
Leukocytes, UA: NEGATIVE
NITRITE UA: NEGATIVE
PROTEIN UA: NEGATIVE
RBC UA: NEGATIVE
UROBILINOGEN UA: NEGATIVE
pH, UA: 6

## 2015-01-05 LAB — LIPID PANEL
Cholesterol: 272 mg/dL — ABNORMAL HIGH (ref 0–200)
HDL: 69 mg/dL (ref >=46)
LDL Cholesterol: 182 mg/dL — ABNORMAL HIGH (ref 0–99)
Total CHOL/HDL Ratio: 3.9 Ratio
Triglycerides: 103 mg/dL (ref <150)
VLDL: 21 mg/dL (ref 0–40)

## 2015-01-05 LAB — CBC WITH DIFFERENTIAL/PLATELET
BASOS ABS: 0 10*3/uL (ref 0.0–0.1)
BASOS PCT: 0 % (ref 0–1)
EOS ABS: 0.1 10*3/uL (ref 0.0–0.7)
Eosinophils Relative: 1 % (ref 0–5)
HEMATOCRIT: 44.1 % (ref 36.0–46.0)
HEMOGLOBIN: 15.2 g/dL — AB (ref 12.0–15.0)
Lymphocytes Relative: 41 % (ref 12–46)
Lymphs Abs: 2.7 10*3/uL (ref 0.7–4.0)
MCH: 33.2 pg (ref 26.0–34.0)
MCHC: 34.5 g/dL (ref 30.0–36.0)
MCV: 96.3 fL (ref 78.0–100.0)
MPV: 10.6 fL (ref 8.6–12.4)
Monocytes Absolute: 0.3 10*3/uL (ref 0.1–1.0)
Monocytes Relative: 5 % (ref 3–12)
NEUTROS ABS: 3.4 10*3/uL (ref 1.7–7.7)
NEUTROS PCT: 53 % (ref 43–77)
PLATELETS: 192 10*3/uL (ref 150–400)
RBC: 4.58 MIL/uL (ref 3.87–5.11)
RDW: 12.4 % (ref 11.5–15.5)
WBC: 6.5 10*3/uL (ref 4.0–10.5)

## 2015-01-05 LAB — HEMOGLOBIN A1C
HEMOGLOBIN A1C: 6 % — AB (ref <5.7)
MEAN PLASMA GLUCOSE: 126 mg/dL — AB (ref <117)

## 2015-01-05 LAB — COMPREHENSIVE METABOLIC PANEL
ALT: 19 U/L (ref 0–35)
AST: 19 U/L (ref 0–37)
Albumin: 4.3 g/dL (ref 3.5–5.2)
Alkaline Phosphatase: 54 U/L (ref 39–117)
BILIRUBIN TOTAL: 0.6 mg/dL (ref 0.2–1.2)
BUN: 12 mg/dL (ref 6–23)
CALCIUM: 9.1 mg/dL (ref 8.4–10.5)
CO2: 24 mEq/L (ref 19–32)
CREATININE: 0.66 mg/dL (ref 0.50–1.10)
Chloride: 105 mEq/L (ref 96–112)
Glucose, Bld: 85 mg/dL (ref 70–99)
Potassium: 4.5 mEq/L (ref 3.5–5.3)
Sodium: 142 mEq/L (ref 135–145)
Total Protein: 6.3 g/dL (ref 6.0–8.3)

## 2015-01-05 LAB — TSH: TSH: 0.851 u[IU]/mL (ref 0.350–4.500)

## 2015-01-05 NOTE — Progress Notes (Signed)
Patient ID: Anne Kelly, female   DOB: 1956-08-31, 59 y.o.   MRN: [id number] 59 y.o. G81P3003 Married  Caucasian Fe here for annual exam.  No new health problems. Her daughter still doing pediatric ortho and sports med residency in [place].  Another son is head of his Ortho department in [place]. Another son is Chief Financial Officer.  Patient's last menstrual period was 03/11/2012 (exact date).  Pt is post menopausal.        Sexually active: Yes.    The current method of family planning is post menopausal status.    Exercising: Yes.    walking and paddle boarding in summer Smoker:  no  Health Maintenance: Pap:  07/23/12, negative with neg HR HPV MMG:  03/19/14, Bi-Rads 1:  Negative  Colonoscopy:  03/07/06, repeat in 10 years IFOB is given BMD:   Never   TDaP:  01/31/13 Labs:  HB:  14.6  Urine:  Negative    reports that she has never smoked. She has never used smokeless tobacco. She reports that she drinks about 1.8 - 2.4 oz of alcohol per week. She reports that she does not use illicit drugs.  Past Medical History  Diagnosis Date  . Allergic rhinitis   . No pertinent past medical history     Past Surgical History  Procedure Laterality Date  . Reconstruction of nose  1980  . Vaginal delivery  1982, 1984, 1986  . Abdominal adhesion surgery  2004    intestinal blockage  . Hysteroscopy  6/13    D & C, myomectomy X 3  . Colonoscopy  03/07/06    early diverticulitis, recheck in 10 years  . Foot surgery Left 1980    cyst from heel    Current Outpatient Prescriptions  Medication Sig Dispense Refill  . fish oil-omega-3 fatty acids 1000 MG capsule Take 2 g by mouth daily.    Marland Kitchen MELATONIN PO Take 1 tablet by mouth at bedtime as needed.    . Multiple Vitamins-Minerals (MULTIVITAMIN WITH MINERALS) tablet Take 1 tablet by mouth daily.    Marland Kitchen Resveratrol 250 MG CAPS Take 500 mg by mouth daily.    . Vitamin D, Ergocalciferol, (DRISDOL) 50000 UNITS CAPS capsule Take 1 capsule (50,000 Units total) by mouth every 7  (seven) days. 30 capsule 3   No current facility-administered medications for this visit.    Family History  Problem Relation Age of Onset  . Hyperlipidemia Mother   . Prostate cancer Father   . Diabetes Father   . Hypertension Father   . Prostate cancer Brother   . Breast cancer Paternal Grandmother     ROS:  Pertinent items are noted in HPI.  Otherwise, a comprehensive ROS was negative.  Exam:   BP 110/74 mmHg  Pulse 60  Ht 5' 8.25" (1.734 m)  Wt 169 lb (76.658 kg)  BMI 25.50 kg/m2  LMP 03/11/2012 (Exact Date) Height: 5' 8.25" (173.4 cm) Ht Readings from Last 3 Encounters:  01/05/15 5' 8.25" (1.734 m)  07/29/13 5' 8.25" (1.734 m)  04/08/12 5' 8.5" (1.74 m)    General appearance: alert, cooperative and appears stated age Head: Normocephalic, without obvious abnormality, atraumatic Neck: no adenopathy, supple, symmetrical, trachea midline and thyroid normal to inspection and palpation Lungs: clear to auscultation bilaterally Breasts: normal appearance, no masses or tenderness Heart: regular rate and rhythm Abdomen: soft, non-tender; no masses,  no organomegaly Extremities: extremities normal, atraumatic, no cyanosis or edema Skin: Skin color, texture, turgor normal. No rashes or lesions Lymph  nodes: Cervical, supraclavicular, and axillary nodes normal. No abnormal inguinal nodes palpated Neurologic: Grossly normal   Pelvic: External genitalia:  no lesions              Urethra:  normal appearing urethra with no masses, tenderness or lesions              Bartholin's and Skene's: normal                 Vagina: normal appearing vagina with normal color and discharge, no lesions              Cervix: anteverted              Pap taken: Yes.   Bimanual Exam:  Uterus:  normal size, contour, position, consistency, mobility, non-tender              Adnexa: no mass, fullness, tenderness               Rectovaginal: Confirms               Anus:  normal sphincter tone, no  lesions  Chaperone present:  yes  A:  Well Woman with normal exam  Postmenopausal - no HRT History of Vit D deficiency History of PMB with Hysteroscopic resection of UTE fibroids 04/08/12 without further vaginal bleeding   P:   Reviewed health and wellness pertinent to exam  Pap smear taken today  Mammogram is due 5/16  Follow with fasting labs & Vit D - no refill given yet on med's  IFOB is given  Counseled on breast self exam, mammography screening, adequate intake of calcium and vitamin D, diet and exercise return annually or prn  An After Visit Summary was printed and given to the patient.

## 2015-01-05 NOTE — Patient Instructions (Signed)

## 2015-01-06 LAB — HEMOGLOBIN, FINGERSTICK: Hemoglobin, fingerstick: 14.6 g/dL (ref 12.0–16.0)

## 2015-01-06 LAB — VITAMIN D 25 HYDROXY (VIT D DEFICIENCY, FRACTURES): Vit D, 25-Hydroxy: 39 ng/mL (ref 30–100)

## 2015-01-06 NOTE — Progress Notes (Signed)
Encounter reviewed by Dr. Eugenio Dollins Silva.  

## 2015-01-06 NOTE — Telephone Encounter (Signed)
Medication refill request: Vitamin D 50,000 Last AEX:  01/05/15 PG Next AEX: 01/06/16 PG Last MMG (if hormonal medication request): 03/19/14 [id number]:neg Refill authorized: 07/29/13 #[id number]. Today please advise

## 2015-01-07 LAB — IPS PAP TEST WITH HPV

## 2015-02-23 ENCOUNTER — Telehealth: Payer: Self-pay | Admitting: Nurse Practitioner

## 2015-02-23 NOTE — Telephone Encounter (Signed)
LMTCB about canceled appointment °

## 2016-01-06 ENCOUNTER — Ambulatory Visit: Payer: BLUE CROSS/BLUE SHIELD | Admitting: Nurse Practitioner

## 2016-01-11 ENCOUNTER — Ambulatory Visit: Payer: BLUE CROSS/BLUE SHIELD | Admitting: Nurse Practitioner

## 2016-02-21 ENCOUNTER — Encounter: Payer: Self-pay | Admitting: Nurse Practitioner

## 2016-02-21 ENCOUNTER — Ambulatory Visit (INDEPENDENT_AMBULATORY_CARE_PROVIDER_SITE_OTHER): Payer: BLUE CROSS/BLUE SHIELD | Admitting: Nurse Practitioner

## 2016-02-21 VITALS — BP 108/60 | HR 72 | Ht 68.25 in | Wt 168.0 lb

## 2016-02-21 DIAGNOSIS — R8290 Unspecified abnormal findings in urine: Secondary | ICD-10-CM

## 2016-02-21 DIAGNOSIS — E559 Vitamin D deficiency, unspecified: Secondary | ICD-10-CM

## 2016-02-21 DIAGNOSIS — Z0000 Encounter for general adult medical examination without abnormal findings: Secondary | ICD-10-CM

## 2016-02-21 DIAGNOSIS — Z01419 Encounter for gynecological examination (general) (routine) without abnormal findings: Secondary | ICD-10-CM | POA: Diagnosis not present

## 2016-02-21 LAB — POCT URINALYSIS DIPSTICK
BILIRUBIN UA: NEGATIVE
GLUCOSE UA: NEGATIVE
Ketones, UA: NEGATIVE
NITRITE UA: NEGATIVE
Protein, UA: NEGATIVE
RBC UA: NEGATIVE
Urobilinogen, UA: NEGATIVE
pH, UA: 7

## 2016-02-21 LAB — COMPREHENSIVE METABOLIC PANEL
ALBUMIN: 4.6 g/dL (ref 3.6–5.1)
ALK PHOS: 62 U/L (ref 33–130)
ALT: 27 U/L (ref 6–29)
AST: 27 U/L (ref 10–35)
BUN: 12 mg/dL (ref 7–25)
CALCIUM: 9.3 mg/dL (ref 8.6–10.4)
CHLORIDE: 103 mmol/L (ref 98–110)
CO2: 24 mmol/L (ref 20–31)
Creat: 0.77 mg/dL (ref 0.50–0.99)
Glucose, Bld: 101 mg/dL — ABNORMAL HIGH (ref 65–99)
POTASSIUM: 4.6 mmol/L (ref 3.5–5.3)
Sodium: 138 mmol/L (ref 135–146)
TOTAL PROTEIN: 6.8 g/dL (ref 6.1–8.1)
Total Bilirubin: 0.5 mg/dL (ref 0.2–1.2)

## 2016-02-21 LAB — CBC WITH DIFFERENTIAL/PLATELET
BASOS PCT: 1 %
Basophils Absolute: 64 cells/uL (ref 0–200)
EOS PCT: 1 %
Eosinophils Absolute: 64 cells/uL (ref 15–500)
HEMATOCRIT: 44.5 % (ref 35.0–45.0)
HEMOGLOBIN: 15.4 g/dL (ref 11.7–15.5)
LYMPHS ABS: 2240 {cells}/uL (ref 850–3900)
Lymphocytes Relative: 35 %
MCH: 33.6 pg — ABNORMAL HIGH (ref 27.0–33.0)
MCHC: 34.6 g/dL (ref 32.0–36.0)
MCV: 97.2 fL (ref 80.0–100.0)
MONO ABS: 448 {cells}/uL (ref 200–950)
MPV: 10.7 fL (ref 7.5–12.5)
Monocytes Relative: 7 %
NEUTROS ABS: 3584 {cells}/uL (ref 1500–7800)
NEUTROS PCT: 56 %
Platelets: 218 10*3/uL (ref 140–400)
RBC: 4.58 MIL/uL (ref 3.80–5.10)
RDW: 13 % (ref 11.0–15.0)
WBC: 6.4 10*3/uL (ref 3.8–10.8)

## 2016-02-21 LAB — HEMOGLOBIN, FINGERSTICK: HEMOGLOBIN, FINGERSTICK: 14.9 g/dL (ref 12.0–16.0)

## 2016-02-21 LAB — LIPID PANEL
CHOL/HDL RATIO: 3.5 ratio (ref <=5.0)
CHOLESTEROL: 287 mg/dL — AB (ref 125–200)
HDL: 83 mg/dL (ref >=46)
LDL Cholesterol: 187 mg/dL — ABNORMAL HIGH (ref <130)
Triglycerides: 85 mg/dL (ref <150)
VLDL: 17 mg/dL (ref <30)

## 2016-02-21 LAB — HIV ANTIBODY (ROUTINE TESTING W REFLEX): HIV: NONREACTIVE

## 2016-02-21 LAB — HEPATITIS C ANTIBODY: HCV AB: NEGATIVE

## 2016-02-21 LAB — TSH: TSH: 0.79 mIU/L

## 2016-02-21 MED ORDER — VITAMIN D (ERGOCALCIFEROL) 1.25 MG (50000 UNIT) PO CAPS: ORAL_CAPSULE | ORAL | Status: DC

## 2016-02-21 NOTE — Progress Notes (Signed)
Patient ID: Anne Kelly, female   DOB: 05/09/1956, 60 y.o.   MRN: [id number]  60 y.o. G69P3003 Married  Caucasian Fe here for annual exam. She had mole removed from her scalp that was benign.  Her daughter who is in Residency in [place] has chosen [facility] as her number 1 choice for orthopedics.  Family is very happy about this choice.    Patient's last menstrual period was 03/11/2012 (exact date).          Sexually active: Yes.    The current method of family planning is post menopausal status.    Exercising: Yes.    Home exercise routine includes walking 4-8 miles on weekends and 1-2 miles weekdays. Smoker:  no  Health Maintenance: Pap:01/05/15, Negative with neg HR HPV MMG:04/20/15, 3D, Bi-Rads 1: Negative  Colonoscopy: 03/07/06, repeat in 10 years, has apt 02/22/16 for consult BMD: Never  TDaP: 01/31/13 Shingles: Never Hep C and HIV: done today Labs: HB: 14.9    Urine: 1+ Leuk's   reports that she has never smoked. She has never used smokeless tobacco. She reports that she drinks about 1.8 - 2.4 oz of alcohol per week. She reports that she does not use illicit drugs.  Past Medical History  Diagnosis Date  . Allergic rhinitis   . No pertinent past medical history     Past Surgical History  Procedure Laterality Date  . Reconstruction of nose  1980  . Vaginal delivery  1982, 1984, 1986  . Abdominal adhesion surgery  2004    intestinal blockage  . Hysteroscopy  6/13    D & C, myomectomy X 3  . Colonoscopy  03/07/06    early diverticulitis, recheck in 10 years  . Foot surgery Left 1980    cyst from heel    Current Outpatient Prescriptions  Medication Sig Dispense Refill  . Calcium-Magnesium-Vitamin D 600-40-500 MG-MG-UNIT TB24 Take 1 tablet by mouth daily.    . fish oil-omega-3 fatty acids 1000 MG capsule Take 2 g by mouth daily.    Marland Kitchen MELATONIN PO Take 1 tablet by mouth at bedtime as needed.    . Milk Thistle 1000 MG CAPS Take 1 capsule by mouth daily.    . Multiple  Vitamins-Minerals (MULTIVITAMIN WITH MINERALS) tablet Take 1 tablet by mouth daily.    Marland Kitchen Resveratrol 250 MG CAPS Take 500 mg by mouth daily.    . Vitamin D, Ergocalciferol, (DRISDOL) 50000 units CAPS capsule take 1 capsule by mouth EVERY 7 DAYS 30 capsule 3   No current facility-administered medications for this visit.    Family History  Problem Relation Age of Onset  . Hyperlipidemia Mother   . Prostate cancer Father   . Diabetes Father   . Hypertension Father   . Prostate cancer Brother   . Breast cancer Paternal Grandmother     ROS:  Pertinent items are noted in HPI.  Otherwise, a comprehensive ROS was negative.  Exam:   BP 108/60 mmHg  Pulse 72  Ht 5' 8.25" (1.734 m)  Wt 168 lb (76.204 kg)  BMI 25.34 kg/m2  LMP 03/11/2012 (Exact Date) Height: 5' 8.25" (173.4 cm) Ht Readings from Last 3 Encounters:  02/21/16 5' 8.25" (1.734 m)  01/05/15 5' 8.25" (1.734 m)  07/29/13 5' 8.25" (1.734 m)    General appearance: alert, cooperative and appears stated age Head: Normocephalic, without obvious abnormality, atraumatic Neck: no adenopathy, supple, symmetrical, trachea midline and thyroid normal to inspection and palpation Lungs: clear to auscultation bilaterally Breasts:  normal appearance, no masses or tenderness Heart: regular rate and rhythm Abdomen: soft, non-tender; no masses,  no organomegaly Extremities: extremities normal, atraumatic, no cyanosis or edema Skin: Skin color, texture, turgor normal. No rashes or lesions Lymph nodes: Cervical, supraclavicular, and axillary nodes normal. No abnormal inguinal nodes palpated Neurologic: Grossly normal   Pelvic: External genitalia:  no lesions              Urethra:  normal appearing urethra with no masses, tenderness or lesions              Bartholin's and Skene's: normal                 Vagina: normal appearing vagina with normal color and discharge, no lesions              Cervix: anteverted              Pap taken:  No. Bimanual Exam:  Uterus:  normal size, contour, position, consistency, mobility, non-tender              Adnexa: no mass, fullness, tenderness               Rectovaginal: Confirms               Anus:  normal sphincter tone, no lesions  Chaperone present: no  A:  Well Woman with normal exam  Postmenopausal - no HRT History of Vit D deficiency History of PMB with Hysteroscopic resection of UTE fibroids 04/08/12 without further vaginal bleeding   P:   Reviewed health and wellness pertinent to exam  Pap smear as above  Mammogram is due 04/2016  Will follow with labs  Refill Vit D and will follow with labs  She will check with pharmacy about Shingles vaccine  Counseled on breast self exam, mammography screening, adequate intake of calcium and vitamin D, diet and exercise return annually or prn  An After Visit Summary was printed and given to the patient.

## 2016-02-21 NOTE — Patient Instructions (Addendum)

## 2016-02-21 NOTE — Progress Notes (Signed)
Reviewed personally.  M. Suzanne Jayden Kratochvil, MD.  

## 2016-02-22 DIAGNOSIS — Z1211 Encounter for screening for malignant neoplasm of colon: Secondary | ICD-10-CM | POA: Diagnosis not present

## 2016-02-22 LAB — URINE CULTURE: Colony Count: 65000

## 2016-02-22 LAB — HEMOGLOBIN A1C
Hgb A1c MFr Bld: 5.6 % (ref <5.7)
MEAN PLASMA GLUCOSE: 114 mg/dL

## 2016-02-22 LAB — VITAMIN D 25 HYDROXY (VIT D DEFICIENCY, FRACTURES): VIT D 25 HYDROXY: 49 ng/mL (ref 30–100)

## 2016-03-29 DIAGNOSIS — Z1211 Encounter for screening for malignant neoplasm of colon: Secondary | ICD-10-CM | POA: Diagnosis not present

## 2016-03-29 DIAGNOSIS — D123 Benign neoplasm of transverse colon: Secondary | ICD-10-CM | POA: Diagnosis not present

## 2016-03-29 DIAGNOSIS — K621 Rectal polyp: Secondary | ICD-10-CM | POA: Diagnosis not present

## 2016-03-29 DIAGNOSIS — D120 Benign neoplasm of cecum: Secondary | ICD-10-CM | POA: Diagnosis not present

## 2016-03-29 DIAGNOSIS — K635 Polyp of colon: Secondary | ICD-10-CM | POA: Diagnosis not present

## 2016-04-25 DIAGNOSIS — Z1231 Encounter for screening mammogram for malignant neoplasm of breast: Secondary | ICD-10-CM | POA: Diagnosis not present

## 2016-04-25 DIAGNOSIS — Z803 Family history of malignant neoplasm of breast: Secondary | ICD-10-CM | POA: Diagnosis not present

## 2016-05-02 ENCOUNTER — Encounter: Payer: Self-pay | Admitting: Nurse Practitioner

## 2016-06-21 DIAGNOSIS — B070 Plantar wart: Secondary | ICD-10-CM | POA: Diagnosis not present

## 2016-06-21 DIAGNOSIS — Z08 Encounter for follow-up examination after completed treatment for malignant neoplasm: Secondary | ICD-10-CM | POA: Diagnosis not present

## 2016-06-21 DIAGNOSIS — Z85828 Personal history of other malignant neoplasm of skin: Secondary | ICD-10-CM | POA: Diagnosis not present

## 2016-10-10 DIAGNOSIS — H524 Presbyopia: Secondary | ICD-10-CM | POA: Diagnosis not present

## 2016-12-25 DIAGNOSIS — D3615 Benign neoplasm of peripheral nerves and autonomic nervous system of abdomen: Secondary | ICD-10-CM | POA: Diagnosis not present

## 2016-12-25 DIAGNOSIS — Z1283 Encounter for screening for malignant neoplasm of skin: Secondary | ICD-10-CM | POA: Diagnosis not present

## 2016-12-25 DIAGNOSIS — D216 Benign neoplasm of connective and other soft tissue of trunk, unspecified: Secondary | ICD-10-CM | POA: Diagnosis not present

## 2016-12-25 DIAGNOSIS — L821 Other seborrheic keratosis: Secondary | ICD-10-CM | POA: Diagnosis not present

## 2016-12-25 DIAGNOSIS — L820 Inflamed seborrheic keratosis: Secondary | ICD-10-CM | POA: Diagnosis not present

## 2017-02-26 ENCOUNTER — Encounter: Payer: Self-pay | Admitting: Nurse Practitioner

## 2017-02-26 NOTE — Progress Notes (Signed)
Patient ID: Anne Kelly, female   DOB: 06/14/1956, 61 y.o.   MRN: [id number]  61 y.o. G3P3003 Married  Caucasian Fe here for annual exam.  Had colonoscopy 03/29/16 with 2 adenomatous polyps.  For a repeat in late may or June.  No other new health problems. Son is getting married in [place] and the reception is at a [employer]. Daughter has graduated from med school and accepted a position at [employer] in [place] which was her choice.    Patient's last menstrual period was 03/11/2012 (exact date).          Sexually active: Yes.    The current method of family planning is post menopausal status.    Exercising: Yes.    walking daily with dogs and 8 miles on weekends, paddleboarding in summer. Smoker:  no  Health Maintenance: Pap: 01/05/15, Negative with neg HR HPV  07/23/12, Negative with neg HR HPV  History of Abnormal Pap: no MMG: 04/25/16, 3D, Bi-Rads 1:  Negative Self Breast exams: yes Colonoscopy: 03/29/16, tubular adenoma x 2, repeat in 1 year due to incomplete prep BMD: Never TDaP: 01/31/13 Shingles: Never Hep C and HIV: 02/21/16 Labs: HB: 14.6 Fasting labs drawn   reports that she has never smoked. She has never used smokeless tobacco. She reports that she drinks about 1.8 - 2.4 oz of alcohol per week . She reports that she does not use drugs.  Past Medical History:  Diagnosis Date  . Allergic rhinitis   . No pertinent past medical history     Past Surgical History:  Procedure Laterality Date  . ABDOMINAL ADHESION SURGERY  2004   intestinal blockage  . COLONOSCOPY  03/07/06   early diverticulitis, recheck in 10 years  . FOOT SURGERY Left 1980   cyst from heel  . HYSTEROSCOPY  6/13   D & C, myomectomy X 3  . RECONSTRUCTION OF NOSE  1980  . VAGINAL DELIVERY  1982, 1984, 1986    Current Outpatient Prescriptions  Medication Sig Dispense Refill  . Calcium-Magnesium-Vitamin D 600-40-500 MG-MG-UNIT TB24 Take 1 tablet by mouth daily.    Marland Kitchen MELATONIN PO Take 1 tablet  by mouth at bedtime as needed.    . Milk Thistle 1000 MG CAPS Take 1 capsule by mouth daily.    . Multiple Vitamins-Minerals (MULTIVITAMIN WITH MINERALS) tablet Take 1 tablet by mouth daily.    . Probiotic Product (SOLUBLE FIBER/PROBIOTICS PO) Take 2 capsules by mouth daily.    Marland Kitchen Resveratrol 250 MG CAPS Take 500 mg by mouth daily.    . Vitamin D, Ergocalciferol, (DRISDOL) 50000 units CAPS capsule take 1 capsule by mouth EVERY 7 DAYS 30 capsule 3   No current facility-administered medications for this visit.     Family History  Problem Relation Age of Onset  . Hyperlipidemia Mother   . Prostate cancer Father   . Diabetes Father   . Hypertension Father   . Prostate cancer Brother   . Breast cancer Paternal Grandmother     ROS:  Pertinent items are noted in HPI.  Otherwise, a comprehensive ROS was negative.  Exam:   BP 132/74 (BP Location: Right Arm, Patient Position: Sitting, Cuff Size: Normal)   Pulse 72   Ht 5\' 8"  (1.727 m)   Wt 174 lb (78.9 kg)   LMP 03/11/2012 (Exact Date)   BMI 26.46 kg/m  Height: 5\' 8"  (172.7 cm) Ht Readings from Last 3 Encounters:  02/27/17 5\' 8"  (1.727 m)  02/21/16  5' 8.25" (1.734 m)  01/05/15 5' 8.25" (1.734 m)    General appearance: alert, cooperative and appears stated age Head: Normocephalic, without obvious abnormality, atraumatic Neck: no adenopathy, supple, symmetrical, trachea midline and thyroid normal to inspection and palpation Lungs: clear to auscultation bilaterally Breasts: normal appearance, no masses or tenderness Heart: regular rate and rhythm Abdomen: soft, non-tender; no masses,  no organomegaly Extremities: extremities normal, atraumatic, no cyanosis or edema Skin: Skin color, texture, turgor normal. No rashes or lesions Lymph nodes: Cervical, supraclavicular, and axillary nodes normal. No abnormal inguinal nodes palpated Neurologic: Grossly normal   Pelvic: External genitalia:  no lesions              Urethra:  normal  appearing urethra with no masses, tenderness or lesions              Bartholin's and Skene's: normal                 Vagina: normal appearing vagina with normal color and discharge, no lesions              Cervix: anteverted              Pap taken: No. Bimanual Exam:  Uterus:  normal size, contour, position, consistency, mobility, non-tender              Adnexa: no mass, fullness, tenderness               Rectovaginal: Confirms               Anus:  normal sphincter tone, no lesions  Chaperone present: yes  A:  Well Woman with normal exam  Postmenopausal - no HRT History of Vit D deficiency History of PMB with Hysteroscopic resection of UTE fibroids 04/08/12 without further vaginal bleeding   P:   Reviewed health and wellness pertinent to exam  Pap smear: no  Mammogram is due 04/2017 and order for BMD is faxed to [facility]  Follow with labs  Refill on Vit D and will follow with labs.  Counseled on breast self exam, mammography screening, adequate intake of calcium and vitamin D, diet and exercise, Kegel's exercises return annually or prn  An After Visit Summary was printed and given to the patient.

## 2017-02-27 ENCOUNTER — Encounter: Payer: Self-pay | Admitting: Nurse Practitioner

## 2017-02-27 ENCOUNTER — Ambulatory Visit (INDEPENDENT_AMBULATORY_CARE_PROVIDER_SITE_OTHER): Payer: BLUE CROSS/BLUE SHIELD | Admitting: Nurse Practitioner

## 2017-02-27 VITALS — BP 132/74 | HR 72 | Ht 68.0 in | Wt 174.0 lb

## 2017-02-27 DIAGNOSIS — Z0000 Encounter for general adult medical examination without abnormal findings: Secondary | ICD-10-CM

## 2017-02-27 DIAGNOSIS — E559 Vitamin D deficiency, unspecified: Secondary | ICD-10-CM

## 2017-02-27 DIAGNOSIS — Z01411 Encounter for gynecological examination (general) (routine) with abnormal findings: Secondary | ICD-10-CM

## 2017-02-27 LAB — CBC
HEMATOCRIT: 43.8 % (ref 35.0–45.0)
HEMOGLOBIN: 14.9 g/dL (ref 11.7–15.5)
MCH: 33.1 pg — ABNORMAL HIGH (ref 27.0–33.0)
MCHC: 34 g/dL (ref 32.0–36.0)
MCV: 97.3 fL (ref 80.0–100.0)
MPV: 10 fL (ref 7.5–12.5)
Platelets: 205 10*3/uL (ref 140–400)
RBC: 4.5 MIL/uL (ref 3.80–5.10)
RDW: 12.8 % (ref 11.0–15.0)
WBC: 6.7 10*3/uL (ref 3.8–10.8)

## 2017-02-27 LAB — COMPREHENSIVE METABOLIC PANEL
ALBUMIN: 4.3 g/dL (ref 3.6–5.1)
ALK PHOS: 57 U/L (ref 33–130)
ALT: 20 U/L (ref 6–29)
AST: 22 U/L (ref 10–35)
BUN: 9 mg/dL (ref 7–25)
CALCIUM: 9.2 mg/dL (ref 8.6–10.4)
CHLORIDE: 103 mmol/L (ref 98–110)
CO2: 24 mmol/L (ref 20–31)
Creat: 0.75 mg/dL (ref 0.50–0.99)
Glucose, Bld: 90 mg/dL (ref 65–99)
POTASSIUM: 4.4 mmol/L (ref 3.5–5.3)
Sodium: 140 mmol/L (ref 135–146)
TOTAL PROTEIN: 6.7 g/dL (ref 6.1–8.1)
Total Bilirubin: 0.7 mg/dL (ref 0.2–1.2)

## 2017-02-27 LAB — LIPID PANEL
CHOL/HDL RATIO: 3.3 ratio (ref <5.0)
CHOLESTEROL: 275 mg/dL — AB (ref <200)
HDL: 83 mg/dL (ref >50)
LDL Cholesterol: 174 mg/dL — ABNORMAL HIGH (ref <100)
Triglycerides: 88 mg/dL (ref <150)
VLDL: 18 mg/dL (ref <30)

## 2017-02-27 LAB — HEMOGLOBIN, FINGERSTICK: Hemoglobin, fingerstick: 14.6 g/dL (ref 12.0–15.0)

## 2017-02-27 LAB — TSH: TSH: 0.88 mIU/L

## 2017-02-27 MED ORDER — VITAMIN D (ERGOCALCIFEROL) 1.25 MG (50000 UNIT) PO CAPS: ORAL_CAPSULE | ORAL | 3 refills | Status: DC

## 2017-02-27 NOTE — Patient Instructions (Signed)

## 2017-02-28 LAB — HEMOGLOBIN A1C
HEMOGLOBIN A1C: 5.2 % (ref <5.7)
MEAN PLASMA GLUCOSE: 103 mg/dL

## 2017-02-28 LAB — VITAMIN D 25 HYDROXY (VIT D DEFICIENCY, FRACTURES): VIT D 25 HYDROXY: 50 ng/mL (ref 30–100)

## 2017-03-01 NOTE — Progress Notes (Signed)
Encounter reviewed by Dr. Hayden Mabin Amundson C. Silva.  

## 2017-03-15 DIAGNOSIS — K5904 Chronic idiopathic constipation: Secondary | ICD-10-CM | POA: Diagnosis not present

## 2017-03-15 DIAGNOSIS — Z1211 Encounter for screening for malignant neoplasm of colon: Secondary | ICD-10-CM | POA: Diagnosis not present

## 2017-04-30 DIAGNOSIS — Z803 Family history of malignant neoplasm of breast: Secondary | ICD-10-CM | POA: Diagnosis not present

## 2017-04-30 DIAGNOSIS — Z1231 Encounter for screening mammogram for malignant neoplasm of breast: Secondary | ICD-10-CM | POA: Diagnosis not present

## 2017-04-30 DIAGNOSIS — Z780 Asymptomatic menopausal state: Secondary | ICD-10-CM | POA: Diagnosis not present

## 2017-05-07 ENCOUNTER — Telehealth: Payer: Self-pay | Admitting: Nurse Practitioner

## 2017-05-07 NOTE — Telephone Encounter (Signed)
Left detailed results on patients machine. dpr signed.

## 2017-05-07 NOTE — Telephone Encounter (Signed)
Please let pt know that BMD done on 04/30/17 shows a T score at the spine at +0.20; right hip neck at -0.60; left hip neck at +0.20.  She is in the normal range.  Congratulations on the exercise program with walking and paddle boarding.  Will repeat again in about 5 yrs.

## 2017-05-23 DIAGNOSIS — Z1211 Encounter for screening for malignant neoplasm of colon: Secondary | ICD-10-CM | POA: Diagnosis not present

## 2017-05-23 DIAGNOSIS — K5730 Diverticulosis of large intestine without perforation or abscess without bleeding: Secondary | ICD-10-CM | POA: Diagnosis not present

## 2017-06-04 ENCOUNTER — Encounter: Payer: Self-pay | Admitting: Certified Nurse Midwife

## 2017-07-02 DIAGNOSIS — Z1283 Encounter for screening for malignant neoplasm of skin: Secondary | ICD-10-CM | POA: Diagnosis not present

## 2017-07-02 DIAGNOSIS — L821 Other seborrheic keratosis: Secondary | ICD-10-CM | POA: Diagnosis not present

## 2017-07-02 DIAGNOSIS — D2262 Melanocytic nevi of left upper limb, including shoulder: Secondary | ICD-10-CM | POA: Diagnosis not present

## 2017-07-02 DIAGNOSIS — D225 Melanocytic nevi of trunk: Secondary | ICD-10-CM | POA: Diagnosis not present

## 2017-11-14 ENCOUNTER — Encounter (HOSPITAL_BASED_OUTPATIENT_CLINIC_OR_DEPARTMENT_OTHER): Payer: Self-pay

## 2017-11-14 ENCOUNTER — Emergency Department (HOSPITAL_BASED_OUTPATIENT_CLINIC_OR_DEPARTMENT_OTHER): Payer: BLUE CROSS/BLUE SHIELD

## 2017-11-14 ENCOUNTER — Observation Stay (HOSPITAL_BASED_OUTPATIENT_CLINIC_OR_DEPARTMENT_OTHER)
Admission: EM | Admit: 2017-11-14 | Discharge: 2017-11-15 | Disposition: A | Discharge status: Home / Self Care | Payer: BLUE CROSS/BLUE SHIELD | Attending: Internal Medicine | Admitting: Internal Medicine

## 2017-11-14 ENCOUNTER — Other Ambulatory Visit: Payer: Self-pay

## 2017-11-14 DIAGNOSIS — G4700 Insomnia, unspecified: Secondary | ICD-10-CM | POA: Diagnosis not present

## 2017-11-14 DIAGNOSIS — E876 Hypokalemia: Secondary | ICD-10-CM | POA: Diagnosis not present

## 2017-11-14 DIAGNOSIS — Z880 Allergy status to penicillin: Secondary | ICD-10-CM | POA: Insufficient documentation

## 2017-11-14 DIAGNOSIS — A084 Viral intestinal infection, unspecified: Secondary | ICD-10-CM | POA: Diagnosis not present

## 2017-11-14 DIAGNOSIS — Z881 Allergy status to other antibiotic agents status: Secondary | ICD-10-CM | POA: Insufficient documentation

## 2017-11-14 DIAGNOSIS — R51 Headache: Secondary | ICD-10-CM | POA: Diagnosis not present

## 2017-11-14 DIAGNOSIS — E871 Hypo-osmolality and hyponatremia: Secondary | ICD-10-CM | POA: Diagnosis not present

## 2017-11-14 DIAGNOSIS — E860 Dehydration: Secondary | ICD-10-CM | POA: Diagnosis not present

## 2017-11-14 DIAGNOSIS — Z79899 Other long term (current) drug therapy: Secondary | ICD-10-CM | POA: Diagnosis not present

## 2017-11-14 DIAGNOSIS — R112 Nausea with vomiting, unspecified: Secondary | ICD-10-CM | POA: Diagnosis not present

## 2017-11-14 DIAGNOSIS — R519 Headache, unspecified: Secondary | ICD-10-CM

## 2017-11-14 HISTORY — DX: Unspecified intestinal obstruction, unspecified as to partial versus complete obstruction: K56.609

## 2017-11-14 HISTORY — DX: Hypo-osmolality and hyponatremia: E87.1

## 2017-11-14 LAB — URINALYSIS, ROUTINE W REFLEX MICROSCOPIC
Bilirubin Urine: NEGATIVE
GLUCOSE, UA: NEGATIVE mg/dL
HGB URINE DIPSTICK: NEGATIVE
KETONES UR: 15 mg/dL — AB
Leukocytes, UA: NEGATIVE
Nitrite: NEGATIVE
PH: 7.5 (ref 5.0–8.0)
PROTEIN: NEGATIVE mg/dL
Specific Gravity, Urine: 1.01 (ref 1.005–1.030)

## 2017-11-14 LAB — CBC WITH DIFFERENTIAL/PLATELET
Basophils Absolute: 0 10*3/uL (ref 0.0–0.1)
Basophils Relative: 0 %
EOS ABS: 0 10*3/uL (ref 0.0–0.7)
EOS PCT: 0 %
HCT: 38.7 % (ref 36.0–46.0)
Hemoglobin: 14.8 g/dL (ref 12.0–15.0)
LYMPHS ABS: 1.8 10*3/uL (ref 0.7–4.0)
LYMPHS PCT: 15 %
MCH: 34.4 pg — AB (ref 26.0–34.0)
MCHC: 38.2 g/dL — AB (ref 30.0–36.0)
MCV: 90 fL (ref 78.0–100.0)
MONOS PCT: 8 %
Monocytes Absolute: 1 10*3/uL (ref 0.1–1.0)
Neutro Abs: 9.1 10*3/uL — ABNORMAL HIGH (ref 1.7–7.7)
Neutrophils Relative %: 77 %
PLATELETS: 235 10*3/uL (ref 150–400)
RBC: 4.3 MIL/uL (ref 3.87–5.11)
RDW: 10.9 % — ABNORMAL LOW (ref 11.5–15.5)
WBC: 11.9 10*3/uL — ABNORMAL HIGH (ref 4.0–10.5)

## 2017-11-14 LAB — PHOSPHORUS: PHOSPHORUS: 3 mg/dL (ref 2.5–4.6)

## 2017-11-14 LAB — BASIC METABOLIC PANEL
ANION GAP: 9 (ref 5–15)
Anion gap: 10 (ref 5–15)
Anion gap: 8 (ref 5–15)
BUN: 6 mg/dL (ref 6–20)
BUN: 6 mg/dL (ref 6–20)
BUN: 6 mg/dL (ref 6–20)
CHLORIDE: 83 mmol/L — AB (ref 101–111)
CHLORIDE: 99 mmol/L — AB (ref 101–111)
CO2: 24 mmol/L (ref 22–32)
CO2: 23 mmol/L (ref 22–32)
CO2: 23 mmol/L (ref 22–32)
CREATININE: 0.53 mg/dL (ref 0.44–1.00)
Calcium: 8.8 mg/dL — ABNORMAL LOW (ref 8.9–10.3)
Calcium: 8.5 mg/dL — ABNORMAL LOW (ref 8.9–10.3)
Calcium: 8.7 mg/dL — ABNORMAL LOW (ref 8.9–10.3)
Chloride: 94 mmol/L — ABNORMAL LOW (ref 101–111)
Creatinine, Ser: 0.57 mg/dL (ref 0.44–1.00)
Creatinine, Ser: 0.5 mg/dL (ref 0.44–1.00)
GFR calc Af Amer: >60 mL/min (ref >60)
GFR calc Af Amer: >60 mL/min (ref >60)
GFR calc Af Amer: >60 mL/min (ref >60)
GFR calc non Af Amer: >60 mL/min (ref >60)
Glucose, Bld: 119 mg/dL — ABNORMAL HIGH (ref 65–99)
Glucose, Bld: 108 mg/dL — ABNORMAL HIGH (ref 65–99)
Glucose, Bld: 107 mg/dL — ABNORMAL HIGH (ref 65–99)
POTASSIUM: 2.9 mmol/L — AB (ref 3.5–5.1)
POTASSIUM: 3 mmol/L — AB (ref 3.5–5.1)
Potassium: 3.3 mmol/L — ABNORMAL LOW (ref 3.5–5.1)
SODIUM: 130 mmol/L — AB (ref 135–145)
Sodium: 117 mmol/L — CL (ref 135–145)
Sodium: 126 mmol/L — ABNORMAL LOW (ref 135–145)

## 2017-11-14 LAB — CREATININE, URINE, RANDOM: Creatinine, Urine: 76.72 mg/dL

## 2017-11-14 LAB — SODIUM, URINE, RANDOM: SODIUM UR: 34 mmol/L

## 2017-11-14 LAB — OSMOLALITY, URINE: OSMOLALITY UR: 330 mosm/kg (ref 300–900)

## 2017-11-14 LAB — HEPATIC FUNCTION PANEL
ALBUMIN: 3.8 g/dL (ref 3.5–5.0)
ALT: 19 U/L (ref 14–54)
AST: 21 U/L (ref 15–41)
Alkaline Phosphatase: 55 U/L (ref 38–126)
BILIRUBIN TOTAL: 1.2 mg/dL (ref 0.3–1.2)
Bilirubin, Direct: 0.2 mg/dL (ref 0.1–0.5)
Indirect Bilirubin: 1 mg/dL — ABNORMAL HIGH (ref 0.3–0.9)
TOTAL PROTEIN: 6.3 g/dL — AB (ref 6.5–8.1)

## 2017-11-14 LAB — MAGNESIUM: MAGNESIUM: 1.9 mg/dL (ref 1.7–2.4)

## 2017-11-14 LAB — OSMOLALITY: Osmolality: 264 mOsm/kg — ABNORMAL LOW (ref 275–295)

## 2017-11-14 MED ORDER — ACETAMINOPHEN 325 MG PO TABS
650.0000 mg | ORAL_TABLET | Freq: Once | ORAL | Status: AC
  Administered 2017-11-14: 650 mg via ORAL
  Filled 2017-11-14: qty 2

## 2017-11-14 MED ORDER — ONDANSETRON HCL 4 MG/2ML IJ SOLN: 4.0000 mg | Freq: Four times a day (QID) | INTRAMUSCULAR | Status: DC | PRN

## 2017-11-14 MED ORDER — POTASSIUM CHLORIDE CRYS ER 20 MEQ PO TBCR
40.0000 meq | EXTENDED_RELEASE_TABLET | Freq: Once | ORAL | Status: AC
  Administered 2017-11-14: 40 meq via ORAL
  Filled 2017-11-14: qty 2

## 2017-11-14 MED ORDER — POTASSIUM CHLORIDE CRYS ER 20 MEQ PO TBCR
40.0000 meq | EXTENDED_RELEASE_TABLET | Freq: Two times a day (BID) | ORAL | Status: AC
  Administered 2017-11-15 (×2): 40 meq via ORAL
  Filled 2017-11-14 (×2): qty 2

## 2017-11-14 MED ORDER — ACETAMINOPHEN 325 MG PO TABS
650.0000 mg | ORAL_TABLET | Freq: Four times a day (QID) | ORAL | Status: DC | PRN
  Administered 2017-11-15: 650 mg via ORAL
  Filled 2017-11-14: qty 2

## 2017-11-14 MED ORDER — MELATONIN 5 MG PO TABS: 5.0000 mg | ORAL_TABLET | Freq: Every evening | ORAL | Status: DC | PRN

## 2017-11-14 MED ORDER — HYDRALAZINE HCL 20 MG/ML IJ SOLN: 10.0000 mg | Freq: Three times a day (TID) | INTRAMUSCULAR | Status: DC | PRN

## 2017-11-14 MED ORDER — ENOXAPARIN SODIUM 40 MG/0.4ML ~~LOC~~ SOLN
40.0000 mg | Freq: Every day | SUBCUTANEOUS | Status: DC
  Administered 2017-11-15: 40 mg via SUBCUTANEOUS
  Filled 2017-11-14: qty 0.4

## 2017-11-14 MED ORDER — SODIUM CHLORIDE 0.9 % IV BOLUS (SEPSIS)
1000.0000 mL | Freq: Once | INTRAVENOUS | Status: AC
  Administered 2017-11-14: 1000 mL via INTRAVENOUS

## 2017-11-14 MED ORDER — SODIUM CHLORIDE 0.9% FLUSH
3.0000 mL | Freq: Two times a day (BID) | INTRAVENOUS | Status: DC
  Administered 2017-11-15: 3 mL via INTRAVENOUS

## 2017-11-14 MED ORDER — POTASSIUM CHLORIDE 10 MEQ/100ML IV SOLN
10.0000 meq | INTRAVENOUS | Status: AC
  Administered 2017-11-14 – 2017-11-15 (×3): 10 meq via INTRAVENOUS
  Filled 2017-11-14 (×3): qty 100

## 2017-11-14 MED ORDER — ACETAMINOPHEN 650 MG RE SUPP: 650.0000 mg | Freq: Four times a day (QID) | RECTAL | Status: DC | PRN

## 2017-11-14 NOTE — ED Triage Notes (Addendum)
C/o HA, n/v x 3 days-sent from PCP office (paperwork with pt) given phenergan 25mg  IM prior to leaving offcie-pt NAD-steady gait

## 2017-11-14 NOTE — ED Provider Notes (Addendum)
Patient care transferred to me.  Has had a headache for the last couple days.  Found to have a sodium of 117.  No clear etiology.  She does not appear overtly dehydrated.  She is not on any medicines that would typically cause this.  She will need to be admitted.  She was given an IV fluid bolus prior to me seeing the patient.  5:55 pm Discussed with Dr. Dyann Kief. Asks for Repeat BMP, urine sodium/creatinine, ua, No more than 75/hr after we get repeat Na back.  Patient's repeat sodium is 126.  At this point, we will hold on further fluids as she does not appear clinically dehydrated.  She will be admitted to the hospitalist service.   Sherwood Gambler, MD 11/15/17 Lynnell Catalan    Sherwood Gambler, MD 11/15/17 0005

## 2017-11-14 NOTE — H&P (Signed)
[facility] History and Physical  Sade Hollon GYJ:[id number] DOB: 07-05-1956 DOA: 11/14/2017  Referring physician:  PCP: Patient, No Pcp Per   Chief Complaint: "I threw up so many times."  HPI: Deloyce Walthers is a 62 y.o. female with past medical history significant for insomnia presents the emergency room with nausea vomiting.  Patient states she had multiple episodes of nausea vomiting.  Numerous.  Nonbloody nonbilious.  Denies abdominal trauma.  Patient states that she had one sick contact that may have infected her.  Tried to treat at home with Pedialyte presented to the ED due to uncontrolled symptoms.  Orders: Labs showed acute hyponatremia.  Patient recommended for admission.   Review of Systems:  As per HPI otherwise 10 point review of systems negative.    Past Medical History:  Diagnosis Date  . Allergic rhinitis   . Bowel obstruction ([place])   . No pertinent past medical history    Past Surgical History:  Procedure Laterality Date  . ABDOMINAL ADHESION SURGERY  2004   intestinal blockage  . COLONOSCOPY  03/07/06   early diverticulitis, recheck in 10 years  . FOOT SURGERY Left 1980   cyst from heel  . HYSTEROSCOPY  6/13   D & C, myomectomy X 3  . RECONSTRUCTION OF NOSE  1980  . VAGINAL DELIVERY  1982, 1984, 1986   Social History:  reports that  has never smoked. she has never used smokeless tobacco. She reports that she drinks alcohol. She reports that she does not use drugs.  Allergies  Allergen Reactions  . Amoxicillin Hives and Shortness Of Breath  . Codeine Nausea And Vomiting  . Penicillins Itching    Has patient had a PCN reaction causing immediate rash, facial/tongue/throat swelling, SOB or lightheadedness with hypotension: Yes Has patient had a PCN reaction causing severe rash involving mucus membranes or skin necrosis: No Has patient had a PCN reaction that required hospitalization: No Has patient had a PCN reaction occurring within the last 10  years: Yes If all of the above answers are "NO", then may proceed with Cephalosporin use.    Family History  Problem Relation Age of Onset  . Hyperlipidemia Mother   . Prostate cancer Father   . Diabetes Father   . Hypertension Father   . Prostate cancer Brother   . Breast cancer Paternal Grandmother      Prior to Admission medications   Medication Sig Start Date End Date Taking? Authorizing Provider  MELATONIN PO Take 1 tablet by mouth at bedtime as needed.   Yes [provider]  Multiple Vitamins-Minerals (MULTIVITAMIN WITH MINERALS) tablet Take 1 tablet by mouth daily.   Yes [provider]  Probiotic Product (SOLUBLE FIBER/PROBIOTICS PO) Take 2 capsules by mouth daily.   Yes [provider]  Resveratrol 250 MG CAPS Take 500 mg by mouth daily.   Yes [provider]  Vitamin D, Ergocalciferol, (DRISDOL) 50000 units CAPS capsule take 1 capsule by mouth EVERY 7 DAYS Patient taking differently: Take 50,000 Units by mouth every 7 (seven) days. On Monday 02/27/17  Yes Kem Boroughs, FNP   Physical Exam: Vitals:   11/14/17 1759 11/14/17 1803 11/14/17 1937 11/14/17 2022  BP: 136/81 136/81 138/75 (!) 131/96  Pulse: 84 91 80 83  Resp:  18 13 20   Temp:    98.3 F (36.8 C)  TempSrc:    Oral  SpO2: 97% 95% 98% 100%  Weight:    77.8 kg (171 lb 8.3 oz)  Height:  5\' 9"  (1.753 m)    Wt Readings from Last 3 Encounters:  11/14/17 77.8 kg (171 lb 8.3 oz)  02/27/17 78.9 kg (174 lb)  02/21/16 76.2 kg (168 lb)    General:  Appears calm and comfortable; A&Ox3 Eyes:  PERRL, EOMI, normal lids, iris ENT:  grossly normal hearing, lips & tongue Neck:  no LAD, masses or thyromegaly Cardiovascular:  RRR, no m/r/g. No LE edema.  Respiratory:  CTA bilaterally, no w/r/r. Normal respiratory effort. Abdomen:  soft, ntnd Skin:  no rash or induration seen on limited exam Musculoskeletal:  grossly normal tone BUE/BLE Psychiatric:  grossly normal mood and affect,  speech fluent and appropriate Neurologic:  CN 2-12 grossly intact, moves all extremities in coordinated fashion.          Labs on Admission:  Basic Metabolic Panel: Recent Labs  Lab 11/14/17 1544 11/14/17 1756 11/14/17 2159  NA 117* 126* 130*  K 3.3* 2.9* 3.0*  CL 83* 94* 99*  CO2 24 23 23   GLUCOSE 119* 108* 107*  BUN 6 6 6   CREATININE 0.53 0.57 0.50  CALCIUM 8.8* 8.5* 8.7*  MG  --   --  1.9  PHOS  --   --  3.0   Liver Function Tests: Recent Labs  Lab 11/14/17 2159  AST 21  ALT 19  ALKPHOS 55  BILITOT 1.2  PROT 6.3*  ALBUMIN 3.8   No results for input(s): LIPASE, AMYLASE in the last 168 hours. No results for input(s): AMMONIA in the last 168 hours. CBC: Recent Labs  Lab 11/14/17 1544  WBC 11.9*  NEUTROABS 9.1*  HGB 14.8  HCT 38.7  MCV 90.0  PLT 235   Cardiac Enzymes: No results for input(s): CKTOTAL, CKMB, CKMBINDEX, TROPONINI in the last 168 hours.  BNP (last 3 results) No results for input(s): BNP in the last 8760 hours.  ProBNP (last 3 results) No results for input(s): PROBNP in the last 8760 hours.   Serum creatinine: 0.5 mg/dL 11/14/17 2159 Estimated creatinine clearance: 76.2 mL/min  CBG: No results for input(s): GLUCAP in the last 168 hours.  Radiological Exams on Admission: Ct Head Wo Contrast  Result Date: 11/14/2017 CLINICAL DATA:  Acute and severe headache. Nausea and vomiting. Symptoms for 3 days. EXAM: CT HEAD WITHOUT CONTRAST TECHNIQUE: Contiguous axial images were obtained from the base of the skull through the vertex without intravenous contrast. COMPARISON:  None. FINDINGS: Brain: No evidence of acute infarction, hemorrhage, hydrocephalus, extra-axial collection or mass lesion/mass effect. Asymmetric but incidental left globus pallidus mineralization. Mild cerebral volume loss that is generalized. Vascular: No hyperdense vessel or unexpected calcification. Skull: Normal. Negative for fracture or focal lesion. Sinuses/Orbits: Negative  IMPRESSION: Negative head CT.  No explanation for headache. Electronically Signed   By: Monte Fantasia M.D.   On: 11/14/2017 15:59    EKG: pending  Assessment/Plan Active Problems:   Hyponatremia  Hyponatremia Patient is already at 24-hour goal so we will hold IV fluids for now and monitor Likely caused by GI losses from recent illness   Insomnia Cont melatonin prn   Code Status: FC  DVT Prophylaxis: lovenox Family Communication: none available Disposition Plan: Pending Improvement  Status: obs tele  Elwin Mocha, MD Family Medicine [facility] [email or web address] Password [email or web address]

## 2017-11-14 NOTE — ED Provider Notes (Signed)
[facility] EMERGENCY DEPARTMENT Provider Note   CSN: [id number] Arrival date & time: 11/14/17  1341     History   Chief Complaint Chief Complaint  Patient presents with  . Headache    HPI Anne Kelly is a 62 y.o. female.  Complaint of left-sided parietal temporal headache onset gradually 3 days ago accompanied by multiple episodes of vomiting.  She also reports left flank pain which occurred 3 days ago which lasted less than 1 day and resolved.  She denies photophobia she denies hematemesis denies fever.  No trauma.  Other associated symptoms include generalized weakness and feels as if she is dehydrated.  She was seen by her PCP earlier today prior to coming here and treated with Phenergan intramuscularly with partial relief.  Headache is now mild and dull in nature.  Her PCP requested a CT scan of her brain.  She has not had similar headaches before.  HPI  Past Medical History:  Diagnosis Date  . Allergic rhinitis   . Bowel obstruction ([facility])   . No pertinent past medical history     There are no active problems to display for this patient.   Past Surgical History:  Procedure Laterality Date  . ABDOMINAL ADHESION SURGERY  2004   intestinal blockage  . COLONOSCOPY  03/07/06   early diverticulitis, recheck in 10 years  . FOOT SURGERY Left 1980   cyst from heel  . HYSTEROSCOPY  6/13   D & C, myomectomy X 3  . RECONSTRUCTION OF NOSE  1980  . VAGINAL DELIVERY  1982, 1984, 1986    OB History    Gravida Para Term Preterm AB Living   3 3 3  0 0 3   SAB TAB Ectopic Multiple Live Births   0 0 0 0 3       Home Medications    Prior to Admission medications   Medication Sig Start Date End Date Taking? Authorizing Provider  Calcium-Magnesium-Vitamin D 364-68-032 MG-MG-UNIT TB24 Take 1 tablet by mouth daily.    [provider]  MELATONIN PO Take 1 tablet by mouth at bedtime as needed.    [provider]  Milk Thistle 1000 MG CAPS Take 1 capsule  by mouth daily.    [provider]  Multiple Vitamins-Minerals (MULTIVITAMIN WITH MINERALS) tablet Take 1 tablet by mouth daily.    [provider]  Probiotic Product (SOLUBLE FIBER/PROBIOTICS PO) Take 2 capsules by mouth daily.    [provider]  Resveratrol 250 MG CAPS Take 500 mg by mouth daily.    [provider]  Vitamin D, Ergocalciferol, (DRISDOL) 50000 units CAPS capsule take 1 capsule by mouth EVERY 7 DAYS 02/27/17   Kem Boroughs, FNP    Family History Family History  Problem Relation Age of Onset  . Hyperlipidemia Mother   . Prostate cancer Father   . Diabetes Father   . Hypertension Father   . Prostate cancer Brother   . Breast cancer Paternal Grandmother     Social History Social History   Tobacco Use  . Smoking status: Never Smoker  . Smokeless tobacco: Never Used  Substance Use Topics  . Alcohol use: Yes    Comment: daily  . Drug use: No     Allergies   Amoxicillin; Codeine; and Penicillins   Review of Systems Review of Systems  HENT: Negative.   Respiratory: Negative.   Cardiovascular: Negative.   Gastrointestinal: Positive for nausea and vomiting.  Genitourinary: Positive for flank pain.  Flank pain resolved  Skin: Negative.   Neurological: Positive for weakness and headaches.  Psychiatric/Behavioral: Negative.   All other systems reviewed and are negative.    Physical Exam Updated Vital Signs BP 127/73 (BP Location: Left Arm)   Pulse 88   Temp 98 F (36.7 C) (Oral)   Resp 18   LMP 03/11/2012 (Exact Date)   SpO2 99%   Physical Exam  Constitutional: She is oriented to person, place, and time. She appears well-developed and well-nourished.  Mucous membranes dry  HENT:  Head: Normocephalic and atraumatic.  Eyes: Conjunctivae are normal. Pupils are equal, round, and reactive to light.  Neck: Neck supple. No tracheal deviation present. No thyromegaly present.  Cardiovascular: Normal rate and  regular rhythm.  No murmur heard. Pulmonary/Chest: Effort normal and breath sounds normal.  Abdominal: Soft. Bowel sounds are normal. She exhibits no distension. There is no tenderness.  Genitourinary:  Genitourinary Comments: No flank tenderness  Musculoskeletal: Normal range of motion. She exhibits no edema or tenderness.  Neurological: She is alert and oriented to person, place, and time. Coordination normal.  Gait normal Romberg normal pronator drift normal finger to nose normal  Skin: Skin is warm and dry. No rash noted.  Psychiatric: She has a normal mood and affect.  Nursing note and vitals reviewed.    ED Treatments / Results  Labs (all labs ordered are listed, but only abnormal results are displayed) Labs Reviewed  BASIC METABOLIC PANEL  CBC WITH DIFFERENTIAL/PLATELET    EKG  EKG Interpretation None       Radiology No results found.  Procedures Procedures (including critical care time)  Medications Ordered in ED Medications  sodium chloride 0.9 % bolus 1,000 mL (not administered)   Declines pain medicine. Results for orders placed or performed in visit on 02/27/17  Hemoglobin, fingerstick  Result Value Ref Range   Hemoglobin, fingerstick 14.6 12.0 - 15.0 g/dL  Lipid panel  Result Value Ref Range   Cholesterol 275 (H) <200 mg/dL   Triglycerides 88 <150 mg/dL   HDL 83 >50 mg/dL   Total CHOL/HDL Ratio 3.3 <5.0 Ratio   VLDL 18 <30 mg/dL   LDL Cholesterol 174 (H) <100 mg/dL  TSH  Result Value Ref Range   TSH 0.88 mIU/L  VITAMIN D 25 Hydroxy (Vit-D Deficiency, Fractures)  Result Value Ref Range   Vit D, 25-Hydroxy 50 30 - 100 ng/mL  Comprehensive metabolic panel  Result Value Ref Range   Sodium 140 135 - 146 mmol/L   Potassium 4.4 3.5 - 5.3 mmol/L   Chloride 103 98 - 110 mmol/L   CO2 24 20 - 31 mmol/L   Glucose, Bld 90 65 - 99 mg/dL   BUN 9 7 - 25 mg/dL   Creat 0.75 0.50 - 0.99 mg/dL   Total Bilirubin 0.7 0.2 - 1.2 mg/dL   Alkaline Phosphatase  57 33 - 130 U/L   AST 22 10 - 35 U/L   ALT 20 6 - 29 U/L   Total Protein 6.7 6.1 - 8.1 g/dL   Albumin 4.3 3.6 - 5.1 g/dL   Calcium 9.2 8.6 - 10.4 mg/dL  CBC  Result Value Ref Range   WBC 6.7 3.8 - 10.8 K/uL   RBC 4.50 3.80 - 5.10 MIL/uL   Hemoglobin 14.9 11.7 - 15.5 g/dL   HCT 43.8 35.0 - 45.0 %   MCV 97.3 80.0 - 100.0 fL   MCH 33.1 (H) 27.0 - 33.0 pg   MCHC 34.0 32.0 -  36.0 g/dL   RDW 12.8 11.0 - 15.0 %   Platelets 205 140 - 400 K/uL   MPV 10.0 7.5 - 12.5 fL  Hemoglobin A1c  Result Value Ref Range   Hgb A1c MFr Bld 5.2 <5.7 %   Mean Plasma Glucose 103 mg/dL   Ct Head Wo Contrast  Result Date: 11/14/2017 CLINICAL DATA:  Acute and severe headache. Nausea and vomiting. Symptoms for 3 days. EXAM: CT HEAD WITHOUT CONTRAST TECHNIQUE: Contiguous axial images were obtained from the base of the skull through the vertex without intravenous contrast. COMPARISON:  None. FINDINGS: Brain: No evidence of acute infarction, hemorrhage, hydrocephalus, extra-axial collection or mass lesion/mass effect. Asymmetric but incidental left globus pallidus mineralization. Mild cerebral volume loss that is generalized. Vascular: No hyperdense vessel or unexpected calcification. Skull: Normal. Negative for fracture or focal lesion. Sinuses/Orbits: Negative IMPRESSION: Negative head CT.  No explanation for headache. Electronically Signed   By: Monte Fantasia M.D.   On: 11/14/2017 15:59   Initial Impression / Assessment and Plan / ED Course  I have reviewed the triage vital signs and the nursing notes.  Pertinent labs & imaging results that were available during my care of the patient were reviewed by me and considered in my medical decision making (see chart for details).     4pm pt signed out to Dr. Regenia Skeeter   Final Clinical Impressions(s) / ED Diagnoses  Dx #1 bad headache #2 nausea and vomiting Final diagnoses:  None    ED Discharge Orders    None       Orlie Dakin, MD 11/14/17 1606

## 2017-11-14 NOTE — Plan of Care (Signed)
  Progressing Education: Knowledge of General Education information will improve 11/14/2017 2230 - Progressing by Talbert Forest, RN Health Behavior/Discharge Planning: Ability to manage health-related needs will improve 11/14/2017 2230 - Progressing by Talbert Forest, RN Coping: Level of anxiety will decrease 11/14/2017 2230 - Progressing by Talbert Forest, RN Pain Managment: General experience of comfort will improve 11/14/2017 2230 - Progressing by Talbert Forest, RN Safety: Ability to remain free from injury will improve 11/14/2017 2230 - Progressing by Talbert Forest, RN

## 2017-11-15 DIAGNOSIS — E876 Hypokalemia: Secondary | ICD-10-CM

## 2017-11-15 DIAGNOSIS — G4700 Insomnia, unspecified: Secondary | ICD-10-CM

## 2017-11-15 DIAGNOSIS — E860 Dehydration: Secondary | ICD-10-CM

## 2017-11-15 DIAGNOSIS — E871 Hypo-osmolality and hyponatremia: Secondary | ICD-10-CM | POA: Diagnosis not present

## 2017-11-15 DIAGNOSIS — R51 Headache: Secondary | ICD-10-CM | POA: Diagnosis not present

## 2017-11-15 DIAGNOSIS — A084 Viral intestinal infection, unspecified: Secondary | ICD-10-CM | POA: Diagnosis present

## 2017-11-15 DIAGNOSIS — R519 Headache, unspecified: Secondary | ICD-10-CM

## 2017-11-15 HISTORY — DX: Dehydration: E86.0

## 2017-11-15 HISTORY — DX: Hypokalemia: E87.6

## 2017-11-15 HISTORY — DX: Viral intestinal infection, unspecified: A08.4

## 2017-11-15 HISTORY — DX: Insomnia, unspecified: G47.00

## 2017-11-15 LAB — BASIC METABOLIC PANEL
ANION GAP: 7 (ref 5–15)
Anion gap: 8 (ref 5–15)
Anion gap: 7 (ref 5–15)
Anion gap: 6 (ref 5–15)
BUN: 6 mg/dL (ref 6–20)
BUN: 7 mg/dL (ref 6–20)
BUN: 8 mg/dL (ref 6–20)
BUN: 9 mg/dL (ref 6–20)
CHLORIDE: 103 mmol/L (ref 101–111)
CO2: 22 mmol/L (ref 22–32)
CO2: 25 mmol/L (ref 22–32)
CO2: 22 mmol/L (ref 22–32)
CO2: 22 mmol/L (ref 22–32)
CREATININE: 0.58 mg/dL (ref 0.44–1.00)
Calcium: 8.9 mg/dL (ref 8.9–10.3)
Calcium: 8.9 mg/dL (ref 8.9–10.3)
Calcium: 9 mg/dL (ref 8.9–10.3)
Calcium: 8.8 mg/dL — ABNORMAL LOW (ref 8.9–10.3)
Chloride: 105 mmol/L (ref 101–111)
Chloride: 106 mmol/L (ref 101–111)
Chloride: 107 mmol/L (ref 101–111)
Creatinine, Ser: 0.58 mg/dL (ref 0.44–1.00)
Creatinine, Ser: 0.59 mg/dL (ref 0.44–1.00)
Creatinine, Ser: 0.63 mg/dL (ref 0.44–1.00)
Glucose, Bld: 105 mg/dL — ABNORMAL HIGH (ref 65–99)
Glucose, Bld: 98 mg/dL (ref 65–99)
Glucose, Bld: 94 mg/dL (ref 65–99)
Glucose, Bld: 96 mg/dL (ref 65–99)
POTASSIUM: 3.8 mmol/L (ref 3.5–5.1)
POTASSIUM: 4 mmol/L (ref 3.5–5.1)
POTASSIUM: 4.3 mmol/L (ref 3.5–5.1)
POTASSIUM: 4.7 mmol/L (ref 3.5–5.1)
SODIUM: 133 mmol/L — AB (ref 135–145)
SODIUM: 137 mmol/L (ref 135–145)
SODIUM: 135 mmol/L (ref 135–145)
SODIUM: 135 mmol/L (ref 135–145)

## 2017-11-15 LAB — CBC
HCT: 39.1 % (ref 36.0–46.0)
Hemoglobin: 14.4 g/dL (ref 12.0–15.0)
MCH: 34.2 pg — ABNORMAL HIGH (ref 26.0–34.0)
MCHC: 36.8 g/dL — ABNORMAL HIGH (ref 30.0–36.0)
MCV: 92.9 fL (ref 78.0–100.0)
PLATELETS: 218 10*3/uL (ref 150–400)
RBC: 4.21 MIL/uL (ref 3.87–5.11)
RDW: 11.5 % (ref 11.5–15.5)
WBC: 7.8 10*3/uL (ref 4.0–10.5)

## 2017-11-15 LAB — HIV ANTIBODY (ROUTINE TESTING W REFLEX): HIV Screen 4th Generation wRfx: NONREACTIVE

## 2017-11-15 MED ORDER — ONDANSETRON HCL 4 MG PO TABS: 4.0000 mg | ORAL_TABLET | Freq: Three times a day (TID) | ORAL | 0 refills | Status: DC | PRN

## 2017-11-15 NOTE — Progress Notes (Signed)
Pt had lunch tol without nausea or vomiting. Lab pending, will update MD. SRP RN

## 2017-11-15 NOTE — Progress Notes (Signed)
Pt discharge to home, instruction given acknowledge understanding. SRP, RN

## 2017-11-15 NOTE — Discharge Summary (Signed)
Physician Discharge Summary  Anne Kelly EXB:[id number] DOB: 08/07/1956 DOA: 11/14/2017  PCP: Patient, No Pcp Per  Admit date: 11/14/2017 Discharge date: 11/15/2017  Time spent: 55 minutes  Recommendations for Outpatient Follow-up:  1. She will pick a PCP to follow-up with in the next 2-3 weeks.  On follow-up patient will need a basic metabolic profile done to follow-up on electrolytes and renal function.  Patient also need a magnesium level checked.   Discharge Diagnoses:  Principal Problem:   Hyponatremia Active Problems:   Viral gastroenteritis   Dehydration   Hypokalemia   Insomnia   Bad headache   Discharge Condition: Stable and improved.  Diet recommendation: High-fiber diet/regular diet.  Filed Weights   11/14/17 2022 11/15/17 0421  Weight: 77.8 kg (171 lb 8.3 oz) 76.3 kg (168 lb 3.4 oz)    History of present illness:  Per Dr.Hobbs Anne Kelly is a 62 y.o. female with past medical history significant for insomnia presents the emergency room with nausea vomiting.  Patient states she had multiple episodes of nausea vomiting.  Numerous.  Nonbloody nonbilious.  Denied abdominal trauma.  Patient stated that she had one sick contact that may have infected her.  Tried to treat at home with Pedialyte presented to the ED due to uncontrolled symptoms.  Orders: Labs showed acute hyponatremia.  Patient recommended for admission.    Hospital Course:  #1 hyponatremia Patient was admitted with hyponatremia with sodium level of 117.  Patient had presented with a probable viral gastroenteritis and it was felt patient's hyponatremia was likely secondary to hypovolemic hyponatremia.  Patient was placed on IV fluids and monitored.  Patient improved clinically.  Patient did not have any further episodes of nausea or emesis.  CT head which was done was unremarkable.  Patient did not have any respiratory symptoms.  Serum osmolality was decreased at 264.  Urinalysis done was unremarkable.   Urine sodium was 34, urine osmolality was 330, urine creatinine was 76.72.  Patient's IV fluids were subsequently discontinued on hospital day 1 and serial BMETs obtained.  Patient's hyponatremia improved and had resolved by day of discharge.  On day of discharge patient's sodium level was 135.  Patient will pick a PCP to follow-up with repeat labs will be needed at that time.  2.  Hypokalemia Patient was noted to be hypokalemic on admission likely secondary to GI losses.  Magnesium level obtained was 1.9.  Patient's potassium was repleted and hypokalemia had resolved by day of discharge.  3.  Probable viral gastroenteritis Patient had presented with GI symptoms of nausea and emesis with nonbloody nonbilious emesis.  Patient had poor oral intake prior to admission.  Patient was placed on IV fluids, antiemetics, supportive care.  Patient did not have any further nausea or emesis.  Patient was started on a diet which was advanced which patient tolerated.  Patient will be discharged home in stable and improved condition.  4.  Headache Patient was noted to have a headache on presentation.  CT head which was done was negative.  Patient had no focal neurological deficits.  Patient was hydrated with IV fluids.  As patient's hyponatremia and electrolyte abnormalities were repleted headache improved and had resolved by day of discharge.  Procedures:  CT head without contrast 11/14/2017    Consultations:  None  Discharge Exam: Vitals:   11/15/17 0420 11/15/17 1353  BP: 100/67 132/90  Pulse: 75 75  Resp: 14 16  Temp: 98.6 F (37 C) 98.5 F (36.9 C)  SpO2:  98% 98%    General: NAD Cardiovascular: RRR Respiratory: CTAB  Discharge Instructions   Discharge Instructions    Diet general   Complete by:  As directed    Increase activity slowly   Complete by:  As directed      Allergies as of 11/15/2017      Reactions   Amoxicillin Hives, Shortness Of Breath   Codeine Nausea And Vomiting    Penicillins Itching   Has patient had a PCN reaction causing immediate rash, facial/tongue/throat swelling, SOB or lightheadedness with hypotension: Yes Has patient had a PCN reaction causing severe rash involving mucus membranes or skin necrosis: No Has patient had a PCN reaction that required hospitalization: No Has patient had a PCN reaction occurring within the last 10 years: Yes If all of the above answers are "NO", then may proceed with Cephalosporin use.      Medication List    TAKE these medications   MELATONIN PO Take 1 tablet by mouth at bedtime as needed.   multivitamin with minerals tablet Take 1 tablet by mouth daily.   ondansetron 4 MG tablet Commonly known as:  ZOFRAN Take 1 tablet (4 mg total) by mouth every 8 (eight) hours as needed for nausea or vomiting.   Resveratrol 250 MG Caps Take 500 mg by mouth daily.   SOLUBLE FIBER/PROBIOTICS PO Take 2 capsules by mouth daily.   Vitamin D (Ergocalciferol) 50000 units Caps capsule Commonly known as:  DRISDOL take 1 capsule by mouth EVERY 7 DAYS What changed:    how much to take  how to take this  when to take this  additional instructions      Allergies  Allergen Reactions  . Amoxicillin Hives and Shortness Of Breath  . Codeine Nausea And Vomiting  . Penicillins Itching    Has patient had a PCN reaction causing immediate rash, facial/tongue/throat swelling, SOB or lightheadedness with hypotension: Yes Has patient had a PCN reaction causing severe rash involving mucus membranes or skin necrosis: No Has patient had a PCN reaction that required hospitalization: No Has patient had a PCN reaction occurring within the last 10 years: Yes If all of the above answers are "NO", then may proceed with Cephalosporin use.   Follow-up Information    PCP. Schedule an appointment as soon as possible for a visit in 2 week(s).   Why:  Follow-up in 2-3 weeks           The results of significant diagnostics from  this hospitalization (including imaging, microbiology, ancillary and laboratory) are listed below for reference.    Significant Diagnostic Studies: Ct Head Wo Contrast  Result Date: 11/14/2017 CLINICAL DATA:  Acute and severe headache. Nausea and vomiting. Symptoms for 3 days. EXAM: CT HEAD WITHOUT CONTRAST TECHNIQUE: Contiguous axial images were obtained from the base of the skull through the vertex without intravenous contrast. COMPARISON:  None. FINDINGS: Brain: No evidence of acute infarction, hemorrhage, hydrocephalus, extra-axial collection or mass lesion/mass effect. Asymmetric but incidental left globus pallidus mineralization. Mild cerebral volume loss that is generalized. Vascular: No hyperdense vessel or unexpected calcification. Skull: Normal. Negative for fracture or focal lesion. Sinuses/Orbits: Negative IMPRESSION: Negative head CT.  No explanation for headache. Electronically Signed   By: Monte Fantasia M.D.   On: 11/14/2017 15:59    Microbiology: No results found for this or any previous visit (from the past 240 hour(s)).   Labs: Basic Metabolic Panel: Recent Labs  Lab 11/14/17 2159 11/15/17 0206 11/15/17 0541 11/15/17 1008  11/15/17 1334  NA 130* 133* 137 135 135  K 3.0* 3.8 4.0 4.3 4.7  CL 99* 103 105 106 107  CO2 23 22 25 22 22   GLUCOSE 107* 105* 98 94 96  BUN 6 6 7 8 9   CREATININE 0.50 0.58 0.59 0.63 0.58  CALCIUM 8.7* 8.9 8.9 9.0 8.8*  MG 1.9  --   --   --   --   PHOS 3.0  --   --   --   --    Liver Function Tests: Recent Labs  Lab 11/14/17 2159  AST 21  ALT 19  ALKPHOS 55  BILITOT 1.2  PROT 6.3*  ALBUMIN 3.8   No results for input(s): LIPASE, AMYLASE in the last 168 hours. No results for input(s): AMMONIA in the last 168 hours. CBC: Recent Labs  Lab 11/14/17 1544 11/15/17 0206  WBC 11.9* 7.8  NEUTROABS 9.1*  --   HGB 14.8 14.4  HCT 38.7 39.1  MCV 90.0 92.9  PLT 235 218   Cardiac Enzymes: No results for input(s): CKTOTAL, CKMB, CKMBINDEX,  TROPONINI in the last 168 hours. BNP: BNP (last 3 results) No results for input(s): BNP in the last 8760 hours.  ProBNP (last 3 results) No results for input(s): PROBNP in the last 8760 hours.  CBG: No results for input(s): GLUCAP in the last 168 hours.     Signed:  Irine Seal MD.  [facility] 11/15/2017, 3:29 PM

## 2018-02-28 ENCOUNTER — Encounter: Payer: Self-pay | Admitting: Certified Nurse Midwife

## 2018-02-28 ENCOUNTER — Ambulatory Visit: Payer: BLUE CROSS/BLUE SHIELD | Admitting: Certified Nurse Midwife

## 2018-02-28 ENCOUNTER — Other Ambulatory Visit: Payer: Self-pay

## 2018-02-28 ENCOUNTER — Other Ambulatory Visit (HOSPITAL_COMMUNITY)
Admission: RE | Admit: 2018-02-28 | Discharge: 2018-02-28 | Disposition: A | Discharge status: Home / Self Care | Payer: BLUE CROSS/BLUE SHIELD | Source: Ambulatory Visit | Attending: Certified Nurse Midwife | Admitting: Certified Nurse Midwife

## 2018-02-28 VITALS — BP 120/80 | HR 72 | Resp 16 | Ht 68.25 in | Wt 172.0 lb

## 2018-02-28 DIAGNOSIS — Z01419 Encounter for gynecological examination (general) (routine) without abnormal findings: Secondary | ICD-10-CM | POA: Diagnosis not present

## 2018-02-28 DIAGNOSIS — Z124 Encounter for screening for malignant neoplasm of cervix: Secondary | ICD-10-CM

## 2018-02-28 DIAGNOSIS — E559 Vitamin D deficiency, unspecified: Secondary | ICD-10-CM

## 2018-02-28 NOTE — Progress Notes (Signed)
62 y.o. G91P3003 Married  Caucasian Fe here for annual exam. Menopausal no HRT. Denies vaginal bleeding or vaginal dryness. Recent ER visit from ? Gastritis and low sodium occurrence. Has new patient visit with Kathyrn Lass in 6/19. Sees Dermatology yearly for skin checks, no new areas. History of Vitamin D deficiency, needs recheck today. No other health issues today. Has daughter who is going to be working for [employer] in [place] as one of the physicians!  Patient's last menstrual period was 03/11/2012 (exact date).          Sexually active: Yes.    The current method of family planning is post menopausal status.    Exercising: Yes.    eliptical, walking, paddleboard Smoker:  no  Health Maintenance: Pap:  01-05-15 neg HPV HR neg History of Abnormal Pap: no MMG:  04-30-17 category b density birads 2:neg Self Breast exams: yes Colonoscopy:  2018 f/u 42yrs BMD:   2018 TDaP:  2014 Shingles: no Pneumonia: no Hep C and HIV: HIV neg 2019, Hep c neg 2017 Labs: with PCP   reports that she has never smoked. She has never used smokeless tobacco. She reports that she drinks about 2.4 oz of alcohol per week. She reports that she does not use drugs.  Past Medical History:  Diagnosis Date  . Allergic rhinitis   . Bowel obstruction ([place])   . No pertinent past medical history     Past Surgical History:  Procedure Laterality Date  . ABDOMINAL ADHESION SURGERY  2004   intestinal blockage  . COLONOSCOPY  03/07/06   early diverticulitis, recheck in 10 years  . FOOT SURGERY Left 1980   cyst from heel  . HYSTEROSCOPY  6/13   D & C, myomectomy X 3  . RECONSTRUCTION OF NOSE  1980  . VAGINAL DELIVERY  1982, 1984, 1986    Current Outpatient Medications  Medication Sig Dispense Refill  . MELATONIN PO Take 1 tablet by mouth at bedtime as needed.    . Multiple Vitamins-Minerals (MULTIVITAMIN WITH MINERALS) tablet Take 1 tablet by mouth daily.    . ondansetron (ZOFRAN) 4 MG tablet Take 1 tablet (4 mg total) by  mouth every 8 (eight) hours as needed for nausea or vomiting. 20 tablet 0  . Probiotic Product (SOLUBLE FIBER/PROBIOTICS PO) Take 2 capsules by mouth daily.    Marland Kitchen Resveratrol 250 MG CAPS Take 500 mg by mouth daily.    . Vitamin D, Ergocalciferol, (DRISDOL) 50000 units CAPS capsule take 1 capsule by mouth EVERY 7 DAYS (Patient taking differently: Take 50,000 Units by mouth every 7 (seven) days. On Monday) 30 capsule 3   No current facility-administered medications for this visit.     Family History  Problem Relation Age of Onset  . Hyperlipidemia Mother   . Prostate cancer Father   . Diabetes Father   . Hypertension Father   . Prostate cancer Brother   . Breast cancer Paternal Grandmother     ROS:  Pertinent items are noted in HPI.  Otherwise, a comprehensive ROS was negative.  Exam:   BP 120/80   Pulse 72   Resp 16   Ht 5' 8.25" (1.734 m)   Wt 172 lb (78 kg)   LMP 03/11/2012 (Exact Date)   BMI 25.96 kg/m  Height: 5' 8.25" (173.4 cm) Ht Readings from Last 3 Encounters:  02/28/18 5' 8.25" (1.734 m)  11/14/17 5\' 9"  (1.753 m)  02/27/17 5\' 8"  (1.727 m)    General appearance: alert, cooperative  and appears stated age Head: Normocephalic, without obvious abnormality, atraumatic Neck: no adenopathy, supple, symmetrical, trachea midline and thyroid normal to inspection and palpation Lungs: clear to auscultation bilaterally Breasts: normal appearance, no masses or tenderness, No nipple retraction or dimpling, No nipple discharge or bleeding, No axillary or supraclavicular adenopathy Heart: regular rate and rhythm Abdomen: soft, non-tender; no masses,  no organomegaly Extremities: extremities normal, atraumatic, no cyanosis or edema Skin: Skin color, texture, turgor normal. No rashes or lesions Lymph nodes: Cervical, supraclavicular, and axillary nodes normal. No abnormal inguinal nodes palpated Neurologic: Grossly normal   Pelvic: External genitalia:  no lesions               Urethra:  normal appearing urethra with no masses, tenderness or lesions              Bartholin's and Skene's: normal                 Vagina: normal appearing vagina with normal color and discharge, no lesions              Cervix: multiparous appearance, no cervical motion tenderness and no lesions              Pap taken: Yes.   Bimanual Exam:  Uterus:  normal size, contour, position, consistency, mobility, non-tender and anteverted              Adnexa: normal adnexa and no mass, fullness, tenderness               Rectovaginal: Confirms               Anus:  normal sphincter tone, no lesions  Chaperone present: yes  A:  Well Woman with normal exam  Menopausal no HRT  Recent ER visit for gastritis related sodium depletion, follow up scheduled with new PCP  Vitamin D deficiency   P:   Reviewed health and wellness pertinent to exam  Discussed importance to advise if vaginal bleeding  Keep follow up appointment regarding sodium issues  Lab: Vitamin D  Pap smear: yes   counseled on breast self exam, mammography screening, adequate intake of calcium and vitamin D, diet and exercise  return annually or prn  An After Visit Summary was printed and given to the patient.

## 2018-02-28 NOTE — Patient Instructions (Signed)

## 2018-03-01 LAB — VITAMIN D 25 HYDROXY (VIT D DEFICIENCY, FRACTURES): Vit D, 25-Hydroxy: 51.6 ng/mL (ref 30.0–100.0)

## 2018-03-04 ENCOUNTER — Ambulatory Visit: Payer: BLUE CROSS/BLUE SHIELD | Admitting: Nurse Practitioner

## 2018-03-04 LAB — CYTOLOGY - PAP
Diagnosis: NEGATIVE
HPV: NOT DETECTED

## 2018-04-08 DIAGNOSIS — Z0000 Encounter for general adult medical examination without abnormal findings: Secondary | ICD-10-CM | POA: Diagnosis not present

## 2018-04-08 DIAGNOSIS — K529 Noninfective gastroenteritis and colitis, unspecified: Secondary | ICD-10-CM | POA: Diagnosis not present

## 2018-04-08 DIAGNOSIS — Z6825 Body mass index (BMI) 25.0-25.9, adult: Secondary | ICD-10-CM | POA: Diagnosis not present

## 2018-04-08 DIAGNOSIS — Z13220 Encounter for screening for lipoid disorders: Secondary | ICD-10-CM | POA: Diagnosis not present

## 2018-05-02 DIAGNOSIS — Z1231 Encounter for screening mammogram for malignant neoplasm of breast: Secondary | ICD-10-CM | POA: Diagnosis not present

## 2018-07-17 DIAGNOSIS — Z1283 Encounter for screening for malignant neoplasm of skin: Secondary | ICD-10-CM | POA: Diagnosis not present

## 2018-07-17 DIAGNOSIS — C44319 Basal cell carcinoma of skin of other parts of face: Secondary | ICD-10-CM | POA: Diagnosis not present

## 2018-07-17 DIAGNOSIS — D225 Melanocytic nevi of trunk: Secondary | ICD-10-CM | POA: Diagnosis not present

## 2018-07-17 DIAGNOSIS — D485 Neoplasm of uncertain behavior of skin: Secondary | ICD-10-CM | POA: Diagnosis not present

## 2018-07-17 DIAGNOSIS — L821 Other seborrheic keratosis: Secondary | ICD-10-CM | POA: Diagnosis not present

## 2018-07-26 IMAGING — CT CT HEAD W/O CM
3 series · 16 of 47 positions shown, 19 images · non-contrast
Comparison: None.

CLINICAL DATA: Acute and severe headache. Nausea and vomiting.
Symptoms for 3 days.

EXAM:
CT HEAD WITHOUT CONTRAST
TECHNIQUE: Contiguous axial images were obtained from the base of the skull
through the vertex without intravenous contrast.

[Series 2: head wo · axial · 0.42mm/px · z∈[-172,-47]mm · 10 of 30 slices shown, 13 images]
[im 3/30  brain]
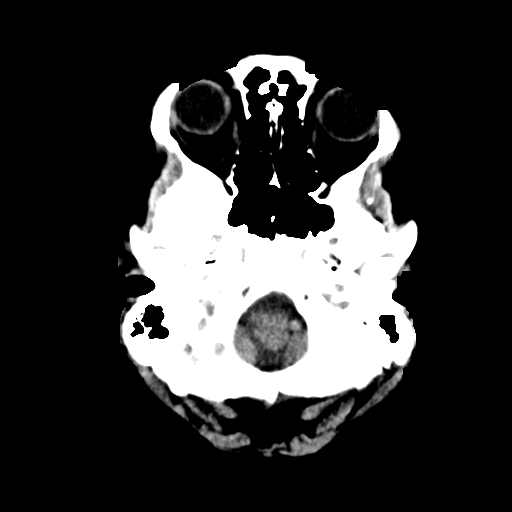
[im 3/30  bone]
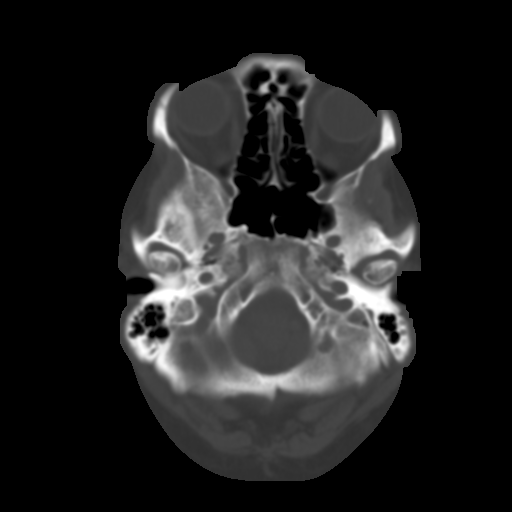
[im 6/30  brain]
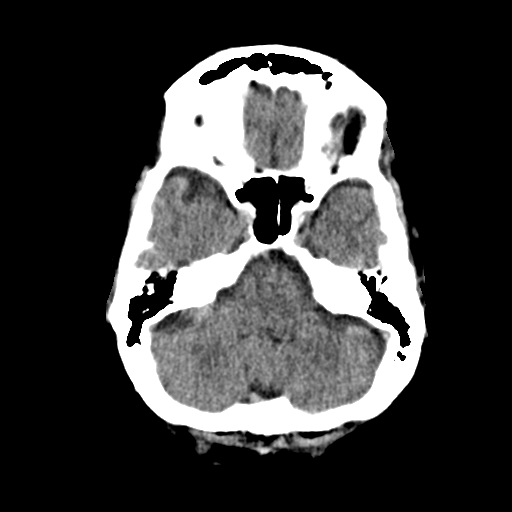
[im 9/30  brain]
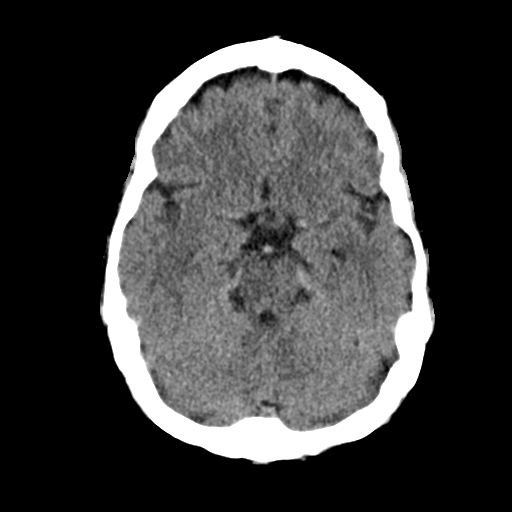
[im 11/30  brain]
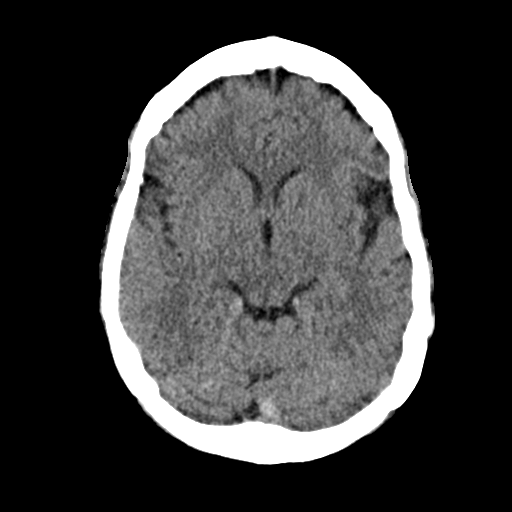
[im 14/30  brain]
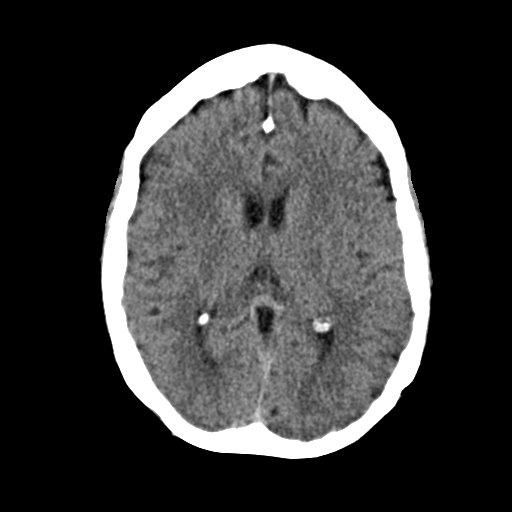
[im 14/30  bone]
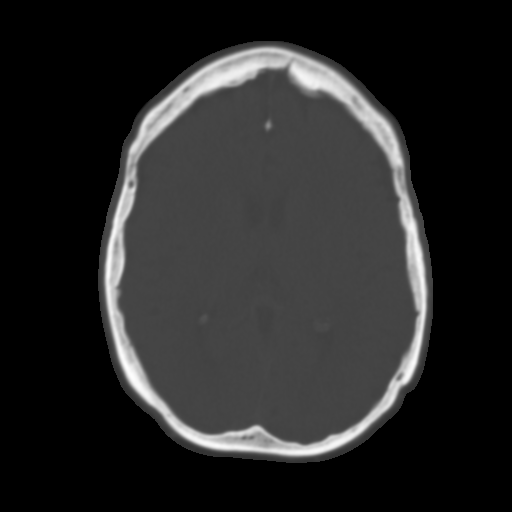
[im 17/30  brain]
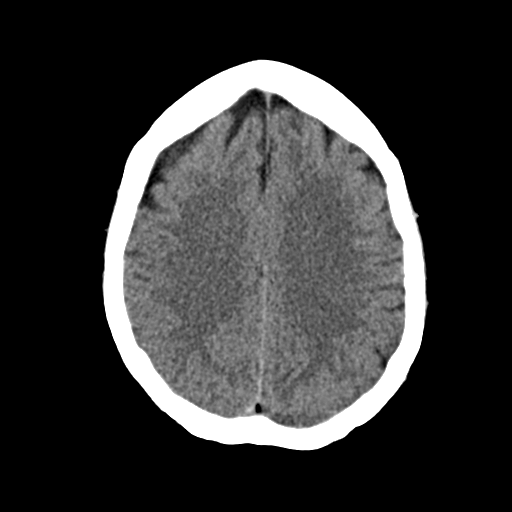
[im 20/30  brain]
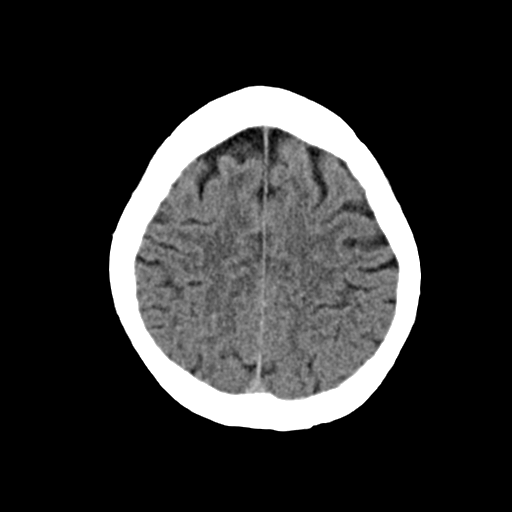
[im 23/30  brain]
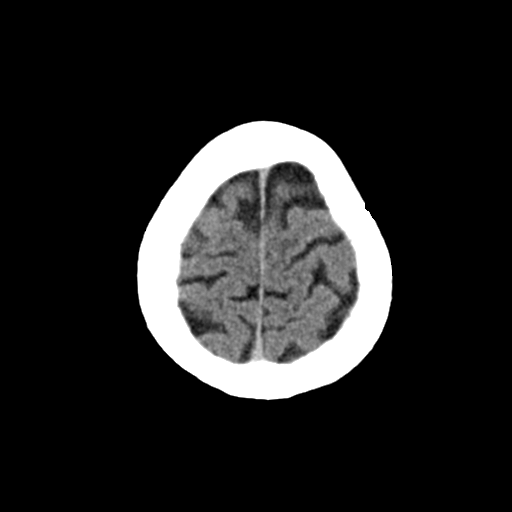
[im 25/30  brain]
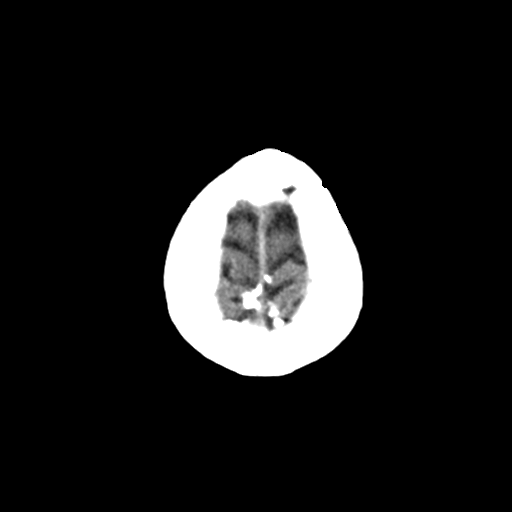
[im 25/30  bone]
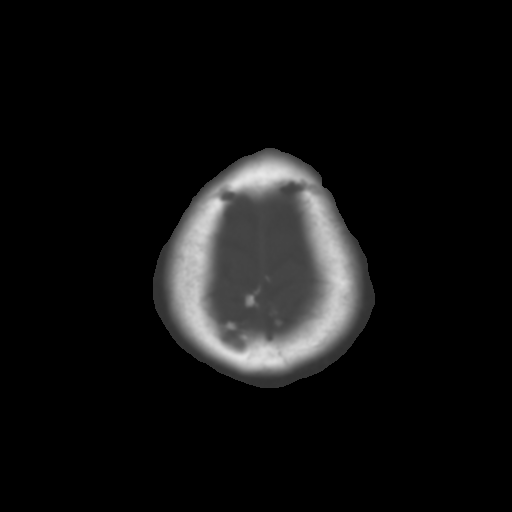
[im 28/30  brain]
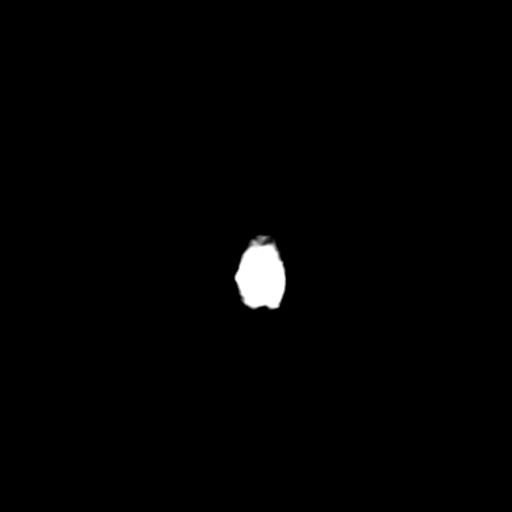

[Series 4: coronal soft · coronal · 0.29mm/px · 3 of 65 slices shown]
[im 22/65  brain]
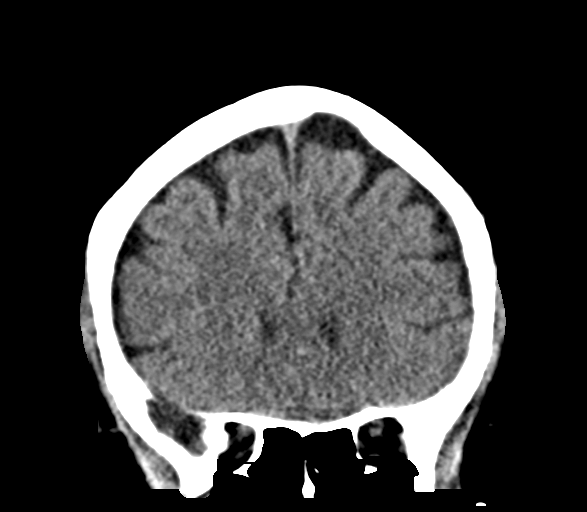
[im 29/65  brain]
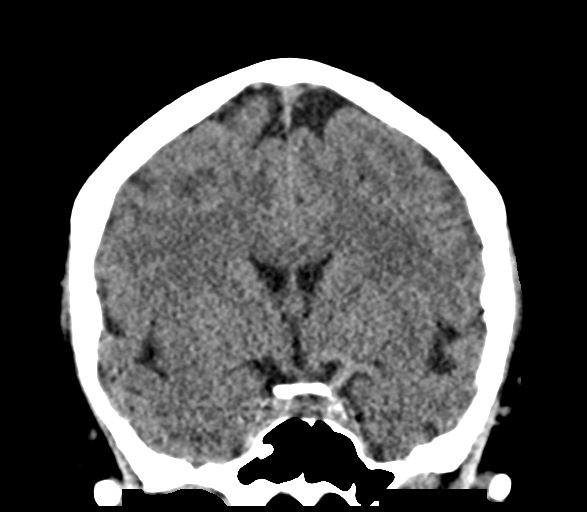
[im 36/65  brain]
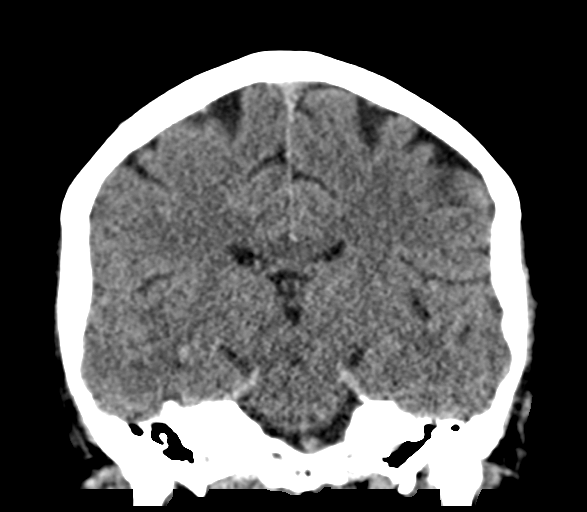

[Series 5: sag soft · sagittal · 0.30mm/px · 3 of 53 slices shown]
[im 18/53  brain]
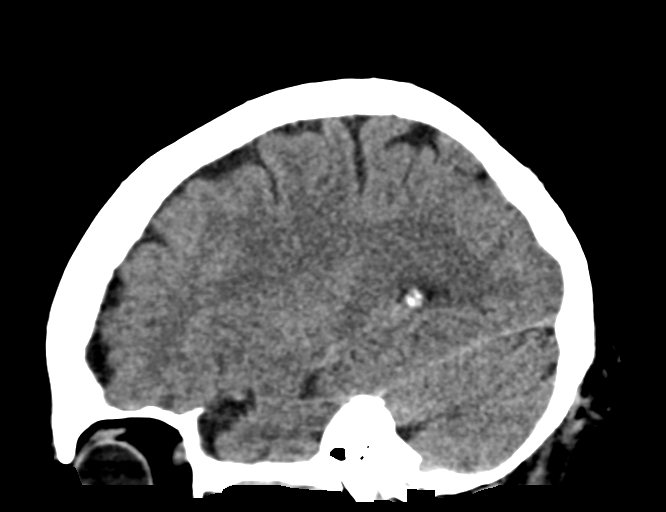
[im 27/53  brain]
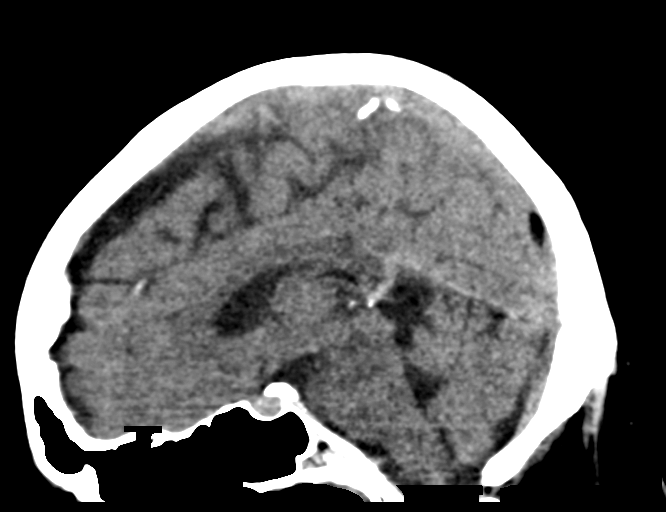
[im 35/53  brain]
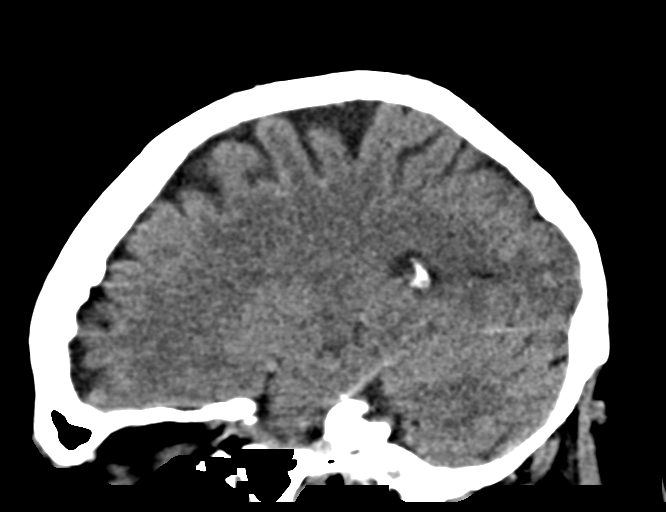

[16 of 47 positions shown; findings below may reference images not displayed]

FINDINGS: Brain: No evidence of acute infarction, hemorrhage, hydrocephalus,
extra-axial collection or mass lesion/mass effect. Asymmetric but
incidental left globus pallidus mineralization. Mild cerebral volume
loss that is generalized.

Vascular: No hyperdense vessel or unexpected calcification.

Skull: Normal. Negative for fracture or focal lesion.

Sinuses/Orbits: Negative
IMPRESSION: Negative head CT.  No explanation for headache.

## 2018-10-16 DIAGNOSIS — Z9889 Other specified postprocedural states: Secondary | ICD-10-CM | POA: Insufficient documentation

## 2018-10-16 DIAGNOSIS — C44319 Basal cell carcinoma of skin of other parts of face: Secondary | ICD-10-CM | POA: Diagnosis not present

## 2018-10-16 DIAGNOSIS — Z85828 Personal history of other malignant neoplasm of skin: Secondary | ICD-10-CM | POA: Insufficient documentation

## 2019-01-09 DIAGNOSIS — M79672 Pain in left foot: Secondary | ICD-10-CM | POA: Diagnosis not present

## 2019-01-09 DIAGNOSIS — M67872 Other specified disorders of synovium, left ankle and foot: Secondary | ICD-10-CM | POA: Diagnosis not present

## 2019-02-25 DIAGNOSIS — H524 Presbyopia: Secondary | ICD-10-CM | POA: Diagnosis not present

## 2019-03-18 ENCOUNTER — Ambulatory Visit: Payer: BLUE CROSS/BLUE SHIELD | Admitting: Certified Nurse Midwife

## 2019-04-09 DIAGNOSIS — Z08 Encounter for follow-up examination after completed treatment for malignant neoplasm: Secondary | ICD-10-CM | POA: Diagnosis not present

## 2019-04-09 DIAGNOSIS — L821 Other seborrheic keratosis: Secondary | ICD-10-CM | POA: Diagnosis not present

## 2019-04-09 DIAGNOSIS — Z1283 Encounter for screening for malignant neoplasm of skin: Secondary | ICD-10-CM | POA: Diagnosis not present

## 2019-04-09 DIAGNOSIS — Z85828 Personal history of other malignant neoplasm of skin: Secondary | ICD-10-CM | POA: Diagnosis not present

## 2019-06-02 ENCOUNTER — Encounter: Payer: Self-pay | Admitting: Certified Nurse Midwife

## 2019-06-02 DIAGNOSIS — Z803 Family history of malignant neoplasm of breast: Secondary | ICD-10-CM | POA: Diagnosis not present

## 2019-06-02 DIAGNOSIS — Z1231 Encounter for screening mammogram for malignant neoplasm of breast: Secondary | ICD-10-CM | POA: Diagnosis not present

## 2019-07-02 DIAGNOSIS — D1801 Hemangioma of skin and subcutaneous tissue: Secondary | ICD-10-CM | POA: Diagnosis not present

## 2019-09-15 ENCOUNTER — Ambulatory Visit: Payer: BC Managed Care – PPO | Admitting: Certified Nurse Midwife

## 2019-09-15 ENCOUNTER — Other Ambulatory Visit: Payer: Self-pay

## 2019-09-15 ENCOUNTER — Encounter: Payer: Self-pay | Admitting: Certified Nurse Midwife

## 2019-09-15 VITALS — BP 118/78 | HR 68 | Temp 97.1°F | Resp 16 | Ht 68.25 in | Wt 177.0 lb

## 2019-09-15 DIAGNOSIS — Z01419 Encounter for gynecological examination (general) (routine) without abnormal findings: Secondary | ICD-10-CM

## 2019-09-15 DIAGNOSIS — Z0000 Encounter for general adult medical examination without abnormal findings: Secondary | ICD-10-CM

## 2019-09-15 DIAGNOSIS — Z780 Asymptomatic menopausal state: Secondary | ICD-10-CM | POA: Diagnosis not present

## 2019-09-15 DIAGNOSIS — R739 Hyperglycemia, unspecified: Secondary | ICD-10-CM | POA: Diagnosis not present

## 2019-09-15 DIAGNOSIS — E559 Vitamin D deficiency, unspecified: Secondary | ICD-10-CM

## 2019-09-15 NOTE — Progress Notes (Signed)
63 y.o. G70P3003 Married  Caucasian Fe here for annual exam. Post menopausal no HRT, no vaginal bleeding or vaginal dryness. Feel breasts continue to change in size, mammogram was normal. Sees PCP for aex, labs, but no visit this year. Would like to do labs today. Had Mose surgery for Basal cancer on face. Sees dermatology frequently. No other health issues today.   Patient's last menstrual period was 03/11/2012 (exact date).          Sexually active: Yes.    The current method of family planning is post menopausal status.    Exercising: Yes.    walking Smoker:  no  Review of Systems  Constitutional: Negative.   HENT: Negative.   Eyes: Negative.   Respiratory: Negative.   Cardiovascular: Negative.   Gastrointestinal: Negative.   Genitourinary: Negative.   Musculoskeletal: Negative.   Skin: Negative.   Neurological: Negative.   Endo/Heme/Allergies: Negative.   Psychiatric/Behavioral: Negative.     Health Maintenance: Pap:  01-05-15 neg HPV HR neg, 02-28-18 neg HPV HR neg History of Abnormal Pap: no MMG:  06-02-2019 category b density birads 1:neg Self Breast exams: yes Colonoscopy:  2018 f/u 11yrs BMD:   2018 normal TDaP:  2014 Shingles: no Pneumonia: no Hep C and HIV: HIV neg 2019, Hep c neg 2017 Labs: if needed   reports that she has never smoked. She has never used smokeless tobacco. She reports current alcohol use of about 4.0 standard drinks of alcohol per week. She reports that she does not use drugs.  Past Medical History:  Diagnosis Date  . Allergic rhinitis   . Bowel obstruction ([place])   . No pertinent past medical history     Past Surgical History:  Procedure Laterality Date  . ABDOMINAL ADHESION SURGERY  2004   intestinal blockage  . COLONOSCOPY  03/07/06   early diverticulitis, recheck in 10 years  . FOOT SURGERY Left 1980   cyst from heel  . HYSTEROSCOPY  6/13   D & C, myomectomy X 3  . RECONSTRUCTION OF NOSE  1980  . VAGINAL DELIVERY  1982, 1984, 1986     Current Outpatient Medications  Medication Sig Dispense Refill  . MELATONIN PO Take 1 tablet by mouth at bedtime as needed.    . Multiple Vitamins-Minerals (MULTIVITAMIN WITH MINERALS) tablet Take 1 tablet by mouth daily.    . ondansetron (ZOFRAN) 4 MG tablet Take 1 tablet (4 mg total) by mouth every 8 (eight) hours as needed for nausea or vomiting. 20 tablet 0  . Probiotic Product (SOLUBLE FIBER/PROBIOTICS PO) Take 2 capsules by mouth daily.    Marland Kitchen Resveratrol 250 MG CAPS Take 500 mg by mouth daily.    . Vitamin D, Ergocalciferol, (DRISDOL) 50000 units CAPS capsule take 1 capsule by mouth EVERY 7 DAYS (Patient taking differently: Take 50,000 Units by mouth every 7 (seven) days. On Monday) 30 capsule 3   No current facility-administered medications for this visit.     Family History  Problem Relation Age of Onset  . Hyperlipidemia Mother   . Prostate cancer Father   . Diabetes Father   . Hypertension Father   . Prostate cancer Brother   . Breast cancer Paternal Grandmother     ROS:  Pertinent items are noted in HPI.  Otherwise, a comprehensive ROS was negative.  Exam:   BP 118/78   Pulse 68   Temp (!) 97.1 F (36.2 C) (Skin)   Resp 16   Ht 5' 8.25" (1.734 m)  Wt 177 lb (80.3 kg)   LMP 03/11/2012 (Exact Date)   BMI 26.72 kg/m  Height: 5' 8.25" (173.4 cm) Ht Readings from Last 3 Encounters:  09/15/19 5' 8.25" (1.734 m)  02/28/18 5' 8.25" (1.734 m)  11/14/17 5\' 9"  (1.753 m)    General appearance: alert, cooperative and appears stated age Head: Normocephalic, without obvious abnormality, atraumatic Neck: no adenopathy, supple, symmetrical, trachea midline and thyroid normal to inspection and palpation Lungs: clear to auscultation bilaterally Breasts: normal appearance, no masses or tenderness, No nipple retraction or dimpling, No nipple discharge or bleeding, No axillary or supraclavicular adenopathy Heart: regular rate and rhythm Abdomen: soft, non-tender; no masses,   no organomegaly Extremities: extremities normal, atraumatic, no cyanosis or edema Skin: Skin color, texture, turgor normal. No rashes or lesions Lymph nodes: Cervical, supraclavicular, and axillary nodes normal. No abnormal inguinal nodes palpated Neurologic: Grossly normal   Pelvic: External genitalia:  no lesions              Urethra:  normal appearing urethra with no masses, tenderness or lesions              Bartholin's and Skene's: normal                 Vagina: normal appearing vagina with normal color and discharge, no lesions              Cervix: no cervical motion tenderness, no lesions and normal appearance              Pap taken: No. Bimanual Exam:  Uterus:  normal size, contour, position, consistency, mobility, non-tender and mid position              Adnexa: normal adnexa and no mass, fullness, tenderness               Rectovaginal: Confirms               Anus:  normal sphincter tone, no lesions  Chaperone present: yes  A:  Well Woman with normal exam  Post menopausal no HRT  History of Mose procedure for basal cell  Screening labs    P:   Reviewed health and wellness pertinent to exam  Aware of need to advise if vaginal bleeding.  Continue follow up with MD as indicated.  Continue with PCP as indicated for aex.  Labs: Lipid panel, CBC, TSH, Vitamin d, CMP  Pap smear: no  counseled on breast self exam, mammography screening, osteoporosis, adequate intake of calcium and vitamin D, diet and exercise, Kegel's exercises return annually or prn  An After Visit Summary was printed and given to the patient.

## 2019-09-15 NOTE — Patient Instructions (Signed)
EXERCISE AND DIET:  We recommended that you start or continue a regular exercise program for good health. Regular exercise means any activity that makes your heart beat faster and makes you sweat.  We recommend exercising at least 30 minutes per day at least 3 days a week, preferably 4 or 5.  We also recommend a diet low in fat and sugar.  Inactivity, poor dietary choices and obesity can cause diabetes, heart attack, stroke, and kidney damage, among others.   ° °ALCOHOL AND SMOKING:  Women should limit their alcohol intake to no more than 7 drinks/beers/glasses of wine (combined, not each!) per week. Moderation of alcohol intake to this level decreases your risk of breast cancer and liver damage. And of course, no recreational drugs are part of a healthy lifestyle.  And absolutely no smoking or even second hand smoke. Most people know smoking can cause heart and lung diseases, but did you know it also contributes to weakening of your bones? Aging of your skin?  Yellowing of your teeth and nails? ° °CALCIUM AND VITAMIN D:  Adequate intake of calcium and Vitamin D are recommended.  The recommendations for exact amounts of these supplements seem to change often, but generally speaking 600 mg of calcium (either carbonate or citrate) and 800 units of Vitamin D per day seems prudent. Certain women may benefit from higher intake of Vitamin D.  If you are among these women, your doctor will have told you during your visit.   ° °PAP SMEARS:  Pap smears, to check for cervical cancer or precancers,  have traditionally been done yearly, although recent scientific advances have shown that most women can have pap smears less often.  However, every woman still should have a physical exam from her gynecologist every year. It will include a breast check, inspection of the vulva and vagina to check for abnormal growths or skin changes, a visual exam of the cervix, and then an exam to evaluate the size and shape of the uterus and  ovaries.  And after 64 years of age, a rectal exam is indicated to check for rectal cancers. We will also provide age appropriate advice regarding health maintenance, like when you should have certain vaccines, screening for sexually transmitted diseases, bone density testing, colonoscopy, mammograms, etc.  ° °MAMMOGRAMS:  All women over 40 years old should have a yearly mammogram. Many facilities now offer a "3D" mammogram, which may cost around $50 extra out of pocket. If possible,  we recommend you accept the option to have the 3D mammogram performed.  It both reduces the number of women who will be called back for extra views which then turn out to be normal, and it is better than the routine mammogram at detecting truly abnormal areas.   ° °COLONOSCOPY:  Colonoscopy to screen for colon cancer is recommended for all women at age 50.  We know, you hate the idea of the prep.  We agree, BUT, having colon cancer and not knowing it is worse!!  Colon cancer so often starts as a polyp that can be seen and removed at colonscopy, which can quite literally save your life!  And if your first colonoscopy is normal and you have no family history of colon cancer, most women don't have to have it again for 10 years.  Once every ten years, you can do something that may end up saving your life, right?  We will be happy to help you get it scheduled when you are ready.    Be sure to check your insurance coverage so you understand how much it will cost.  It may be covered as a preventative service at no cost, but you should check your particular policy.      Kegel Exercises  Kegel exercises can help strengthen your pelvic floor muscles. The pelvic floor is a group of muscles that support your rectum, small intestine, and bladder. In females, pelvic floor muscles also help support the womb (uterus). These muscles help you control the flow of urine and stool. Kegel exercises are painless and simple, and they do not require any  equipment. Your provider may suggest Kegel exercises to:  Improve bladder and bowel control.  Improve sexual response.  Improve weak pelvic floor muscles after surgery to remove the uterus (hysterectomy) or pregnancy (females).  Improve weak pelvic floor muscles after prostate gland removal or surgery (males). Kegel exercises involve squeezing your pelvic floor muscles, which are the same muscles you squeeze when you try to stop the flow of urine or keep from passing gas. The exercises can be done while sitting, standing, or lying down, but it is best to vary your position. Exercises How to do Kegel exercises: 1. Squeeze your pelvic floor muscles tight. You should feel a tight lift in your rectal area. If you are a female, you should also feel a tightness in your vaginal area. Keep your stomach, buttocks, and legs relaxed. 2. Hold the muscles tight for up to 10 seconds. 3. Breathe normally. 4. Relax your muscles. 5. Repeat as told by your health care provider. Repeat this exercise daily as told by your health care provider. Continue to do this exercise for at least 4-6 weeks, or for as long as told by your health care provider. You may be referred to a physical therapist who can help you learn more about how to do Kegel exercises. Depending on your condition, your health care provider may recommend:  Varying how long you squeeze your muscles.  Doing several sets of exercises every day.  Doing exercises for several weeks.  Making Kegel exercises a part of your regular exercise routine. This information is not intended to replace advice given to you by your health care provider. Make sure you discuss any questions you have with your health care provider. Document Released: 10/09/2012 Document Revised: 06/12/2018 Document Reviewed: 06/12/2018 Elsevier Patient Education  2020 [employer].

## 2019-09-16 LAB — CBC
Hematocrit: 45.3 % (ref 34.0–46.6)
Hemoglobin: 16 g/dL — ABNORMAL HIGH (ref 11.1–15.9)
MCH: 33.5 pg — ABNORMAL HIGH (ref 26.6–33.0)
MCHC: 35.3 g/dL (ref 31.5–35.7)
MCV: 95 fL (ref 79–97)
Platelets: 234 10*3/uL (ref 150–450)
RBC: 4.78 x10E6/uL (ref 3.77–5.28)
RDW: 11.7 % (ref 11.7–15.4)
WBC: 8.4 10*3/uL (ref 3.4–10.8)

## 2019-09-16 LAB — VITAMIN D 25 HYDROXY (VIT D DEFICIENCY, FRACTURES): Vit D, 25-Hydroxy: 35.5 ng/mL (ref 30.0–100.0)

## 2019-09-16 LAB — COMPREHENSIVE METABOLIC PANEL
ALT: 23 IU/L (ref 0–32)
AST: 19 IU/L (ref 0–40)
Albumin/Globulin Ratio: 2 (ref 1.2–2.2)
Albumin: 4.5 g/dL (ref 3.8–4.8)
Alkaline Phosphatase: 86 IU/L (ref 39–117)
BUN/Creatinine Ratio: 11 — ABNORMAL LOW (ref 12–28)
BUN: 9 mg/dL (ref 8–27)
Bilirubin Total: 0.5 mg/dL (ref 0.0–1.2)
CO2: 25 mmol/L (ref 20–29)
Calcium: 9.5 mg/dL (ref 8.7–10.3)
Chloride: 101 mmol/L (ref 96–106)
Creatinine, Ser: 0.79 mg/dL (ref 0.57–1.00)
GFR calc Af Amer: 91 mL/min/{1.73_m2} (ref >59)
GFR calc non Af Amer: 79 mL/min/{1.73_m2} (ref >59)
Globulin, Total: 2.3 g/dL (ref 1.5–4.5)
Glucose: 100 mg/dL — ABNORMAL HIGH (ref 65–99)
Potassium: 4.3 mmol/L (ref 3.5–5.2)
Sodium: 141 mmol/L (ref 134–144)
Total Protein: 6.8 g/dL (ref 6.0–8.5)

## 2019-09-16 LAB — TSH: TSH: 0.855 u[IU]/mL (ref 0.450–4.500)

## 2019-09-16 LAB — LIPID PANEL
Chol/HDL Ratio: 3.9 ratio (ref 0.0–4.4)
Cholesterol, Total: 279 mg/dL — ABNORMAL HIGH (ref 100–199)
HDL: 72 mg/dL (ref >39)
LDL Chol Calc (NIH): 186 mg/dL — ABNORMAL HIGH (ref 0–99)
Triglycerides: 120 mg/dL (ref 0–149)
VLDL Cholesterol Cal: 21 mg/dL (ref 5–40)

## 2019-09-17 ENCOUNTER — Telehealth: Payer: Self-pay | Admitting: *Deleted

## 2019-09-17 NOTE — Telephone Encounter (Signed)
Spoke with pt and reviewed Lab results. Pt verbalized understanding. Didn't know at this time if will follow up with PCP or here. Will call back in 3 months.  Will route to D. Hollice Espy, CNM for review. Will close encounter.

## 2019-09-17 NOTE — Telephone Encounter (Signed)
Patient is returning a call to Jill. °

## 2019-09-17 NOTE — Telephone Encounter (Signed)
Notes recorded by Burnice Logan, RN on 09/17/2019 at 9:43 AM EST  Left message to call Sharee Pimple, RN at [facility].     Notes recorded by Regina Eck, CNM on 09/17/2019 at 7:59 AM EST  Notify patient her Liver, kidney and glucose were essentially normal, Glucose was 100, but HGB A1-C is normal at 5.5, Creatinine slightly borderline low no issues , needs to hydrate well daily  Cholesterol is elevated in the concerning range at 279, normal<199  Triglycerides normal at 120, HDL is 72 which is good range normal is >39  LDL is elevated at 186, normal<99  Vitamin D is normal at 35.5 continue with Vitamin D supplement and in diet  CBC is essentially normal with borderline elevation with HGB. Normal is 11.1 -15.9, can occur if hydration level is low. No anemia  TSH is normal range  She needs to work on cholesterol with decrease in fats and high cholesterol foods, work on fiber in diet, fruit with peel, crunchy vegetables, lean meat and fish. Increase water intake.  Start on [place] fish Oil 1000 mg daily  Work on exercise daily recheck in 3 months, we can also go ahead and refer to PCP is she desires for management  Vitamin D is 35.5,prefer closer to 40-50 range in menopause for bone support. Work on dietary intake as well as continue supplement

## 2019-09-30 LAB — SPECIMEN STATUS REPORT

## 2019-09-30 LAB — HGB A1C W/O EAG: Hgb A1c MFr Bld: 5.5 % (ref 4.8–5.6)

## 2019-10-08 DIAGNOSIS — Z85828 Personal history of other malignant neoplasm of skin: Secondary | ICD-10-CM | POA: Diagnosis not present

## 2019-10-08 DIAGNOSIS — Z08 Encounter for follow-up examination after completed treatment for malignant neoplasm: Secondary | ICD-10-CM | POA: Diagnosis not present

## 2019-10-08 DIAGNOSIS — D485 Neoplasm of uncertain behavior of skin: Secondary | ICD-10-CM | POA: Diagnosis not present

## 2020-01-27 ENCOUNTER — Encounter: Payer: Self-pay | Admitting: Certified Nurse Midwife

## 2020-04-07 DIAGNOSIS — D225 Melanocytic nevi of trunk: Secondary | ICD-10-CM | POA: Diagnosis not present

## 2020-04-07 DIAGNOSIS — Z1283 Encounter for screening for malignant neoplasm of skin: Secondary | ICD-10-CM | POA: Diagnosis not present

## 2020-04-07 DIAGNOSIS — B078 Other viral warts: Secondary | ICD-10-CM | POA: Diagnosis not present

## 2020-07-13 DIAGNOSIS — Z1231 Encounter for screening mammogram for malignant neoplasm of breast: Secondary | ICD-10-CM | POA: Diagnosis not present

## 2020-08-24 ENCOUNTER — Encounter: Payer: Self-pay | Admitting: Obstetrics & Gynecology

## 2020-09-15 ENCOUNTER — Ambulatory Visit: Payer: BC Managed Care – PPO | Admitting: Certified Nurse Midwife

## 2020-09-20 ENCOUNTER — Ambulatory Visit: Payer: BC Managed Care – PPO | Admitting: Obstetrics & Gynecology

## 2020-10-06 DIAGNOSIS — D225 Melanocytic nevi of trunk: Secondary | ICD-10-CM | POA: Diagnosis not present

## 2020-10-06 DIAGNOSIS — Z1283 Encounter for screening for malignant neoplasm of skin: Secondary | ICD-10-CM | POA: Diagnosis not present

## 2020-10-06 DIAGNOSIS — L908 Other atrophic disorders of skin: Secondary | ICD-10-CM | POA: Diagnosis not present

## 2021-01-03 ENCOUNTER — Other Ambulatory Visit (HOSPITAL_COMMUNITY)
Admission: RE | Admit: 2021-01-03 | Discharge: 2021-01-03 | Disposition: A | Discharge status: Home / Self Care | Payer: BC Managed Care – PPO | Source: Ambulatory Visit | Attending: Obstetrics & Gynecology | Admitting: Obstetrics & Gynecology

## 2021-01-03 ENCOUNTER — Encounter (HOSPITAL_BASED_OUTPATIENT_CLINIC_OR_DEPARTMENT_OTHER): Payer: Self-pay | Admitting: Obstetrics & Gynecology

## 2021-01-03 ENCOUNTER — Other Ambulatory Visit: Payer: Self-pay

## 2021-01-03 ENCOUNTER — Ambulatory Visit (INDEPENDENT_AMBULATORY_CARE_PROVIDER_SITE_OTHER): Payer: BC Managed Care – PPO | Admitting: Obstetrics & Gynecology

## 2021-01-03 VITALS — BP 120/80 | HR 74 | Ht 68.25 in | Wt 175.4 lb

## 2021-01-03 DIAGNOSIS — Z780 Asymptomatic menopausal state: Secondary | ICD-10-CM

## 2021-01-03 DIAGNOSIS — Z8719 Personal history of other diseases of the digestive system: Secondary | ICD-10-CM | POA: Diagnosis not present

## 2021-01-03 DIAGNOSIS — Z124 Encounter for screening for malignant neoplasm of cervix: Secondary | ICD-10-CM | POA: Diagnosis not present

## 2021-01-03 DIAGNOSIS — Z01419 Encounter for gynecological examination (general) (routine) without abnormal findings: Secondary | ICD-10-CM

## 2021-01-03 NOTE — Progress Notes (Signed)
65 y.o. G53P3003 Married White or Caucasian female here for annual exam.  Prior patient of mine.  Denies vaginal bleeding.    Patient's last menstrual period was 03/11/2012 (exact date).          Sexually active: Yes.    The current method of family planning is post menopausal status.    Exercising: Yes.    Smoker:  no  Health Maintenance: Pap:  2018 History of abnormal Pap:  no MMG:  07/2020 Colonoscopy:  2018, follow up 5 years due to pan-diverticulosis BMD:   2018.  Plan f/u 5 years TDaP:  2014 Pneumonia vaccine(s):  discussed today Shingrix:   Discussed today Hep C testing: 2017 negative Screening Labs: plan with PCP   reports that she has never smoked. She has never used smokeless tobacco. She reports current alcohol use of about 4.0 standard drinks of alcohol per week. She reports that she does not use drugs.  Past Medical History:  Diagnosis Date  . Allergic rhinitis   . Bowel obstruction [facility])     Past Surgical History:  Procedure Laterality Date  . ABDOMINAL ADHESION SURGERY  2004   intestinal blockage  . COLONOSCOPY  03/07/06   early diverticulitis, recheck in 10 years  . FOOT SURGERY Left 1980   cyst from heel  . HYSTEROSCOPY  6/13   D & C, myomectomy X 3  . RECONSTRUCTION OF NOSE  1980    Current Outpatient Medications  Medication Sig Dispense Refill  . Cobalamin Combinations (B-12) [id number] MCG SUBL     . ELDERBERRY PO Take by mouth. With zinc    . MELATONIN PO Take 1 tablet by mouth at bedtime as needed.    . Multiple Vitamins-Minerals (MULTIVITAMIN WITH MINERALS) tablet Take 1 tablet by mouth daily.    . Omega-3 Fatty Acids (FISH OIL) 1000 MG CAPS     . Probiotic Product (PROBIOTIC PO) Take by mouth.    Marland Kitchen VITAMIN D PO Take by mouth.     No current facility-administered medications for this visit.    Family History  Problem Relation Age of Onset  . Hyperlipidemia Mother   . Kidney Stones Mother   . Prostate cancer Father   . Diabetes Father   .  Hypertension Father   . Prostate cancer Brother   . Kidney Stones Brother   . Melanoma Brother   . Breast cancer Paternal Grandmother     Review of Systems  All other systems reviewed and are negative.   Exam:   BP 120/80   Pulse 74   Ht 5' 8.25" (1.734 m)   Wt 175 lb 6.4 oz (79.6 kg)   LMP 03/11/2012 (Exact Date)   BMI 26.47 kg/m   Height: 5' 8.25" (173.4 cm)  General appearance: alert, cooperative and appears stated age Head: Normocephalic, without obvious abnormality, atraumatic Neck: no adenopathy, supple, symmetrical, trachea midline and thyroid normal to inspection and palpation Lungs: clear to auscultation bilaterally Breasts: normal appearance, no masses or tenderness Heart: regular rate and rhythm Abdomen: soft, non-tender; bowel sounds normal; no masses,  no organomegaly Extremities: extremities normal, atraumatic, no cyanosis or edema Skin: Skin color, texture, turgor normal. No rashes or lesions Lymph nodes: Cervical, supraclavicular, and axillary nodes normal. No abnormal inguinal nodes palpated Neurologic: Grossly normal   Pelvic: External genitalia:  no lesions              Urethra:  normal appearing urethra with no masses, tenderness or lesions  Bartholins and Skenes: normal                 Vagina: normal appearing vagina with normal color and discharge, no lesions              Cervix: no lesions              Pap taken: Yes.   Bimanual Exam:  Uterus:  normal size, contour, position, consistency, mobility, non-tender              Adnexa: normal adnexa and no mass, fullness, tenderness               Rectovaginal: Confirms               Anus:  normal sphincter tone, no lesions  Chaperone, Shela Nevin, RN, was present for exam.  Assessment/Plan: 1. Well woman exam with routine gynecological exam - pap obtained today - MMG 07/2020 - colonoscopy up to date - BMD due in 2 years - vaccines reviewed.  Pneumovax is due.  2. Postmenopausal -  no HRT  3. H/O small bowel obstruction

## 2021-01-03 NOTE — Patient Instructions (Signed)
It is time to have the Pneumovax vaccination.

## 2021-01-05 LAB — CYTOLOGY - PAP: Diagnosis: NEGATIVE

## 2021-01-11 ENCOUNTER — Encounter (HOSPITAL_BASED_OUTPATIENT_CLINIC_OR_DEPARTMENT_OTHER): Payer: Self-pay | Admitting: Nurse Practitioner

## 2021-01-11 ENCOUNTER — Ambulatory Visit (HOSPITAL_BASED_OUTPATIENT_CLINIC_OR_DEPARTMENT_OTHER): Payer: BC Managed Care – PPO | Admitting: Nurse Practitioner

## 2021-01-11 ENCOUNTER — Other Ambulatory Visit (HOSPITAL_BASED_OUTPATIENT_CLINIC_OR_DEPARTMENT_OTHER): Payer: Self-pay

## 2021-01-11 ENCOUNTER — Other Ambulatory Visit: Payer: Self-pay

## 2021-01-11 ENCOUNTER — Other Ambulatory Visit (HOSPITAL_BASED_OUTPATIENT_CLINIC_OR_DEPARTMENT_OTHER)
Admission: RE | Admit: 2021-01-11 | Discharge: 2021-01-11 | Disposition: A | Discharge status: Home / Self Care | Payer: BC Managed Care – PPO | Source: Ambulatory Visit | Attending: Nurse Practitioner | Admitting: Nurse Practitioner

## 2021-01-11 VITALS — BP 114/80 | HR 84 | Ht 68.0 in | Wt 173.0 lb

## 2021-01-11 DIAGNOSIS — Z8639 Personal history of other endocrine, nutritional and metabolic disease: Secondary | ICD-10-CM | POA: Insufficient documentation

## 2021-01-11 DIAGNOSIS — Z0000 Encounter for general adult medical examination without abnormal findings: Secondary | ICD-10-CM

## 2021-01-11 DIAGNOSIS — Z7689 Persons encountering health services in other specified circumstances: Secondary | ICD-10-CM

## 2021-01-11 LAB — CBC WITH DIFFERENTIAL/PLATELET
Abs Immature Granulocytes: 0.01 10*3/uL (ref 0.00–0.07)
Basophils Absolute: 0 10*3/uL (ref 0.0–0.1)
Basophils Relative: 1 %
Eosinophils Absolute: 0.1 10*3/uL (ref 0.0–0.5)
Eosinophils Relative: 1 %
HCT: 44.8 % (ref 36.0–46.0)
Hemoglobin: 15.1 g/dL — ABNORMAL HIGH (ref 12.0–15.0)
Immature Granulocytes: 0 %
Lymphocytes Relative: 49 %
Lymphs Abs: 2.7 10*3/uL (ref 0.7–4.0)
MCH: 33.3 pg (ref 26.0–34.0)
MCHC: 33.7 g/dL (ref 30.0–36.0)
MCV: 98.7 fL (ref 80.0–100.0)
Monocytes Absolute: 0.3 10*3/uL (ref 0.1–1.0)
Monocytes Relative: 6 %
Neutro Abs: 2.3 10*3/uL (ref 1.7–7.7)
Neutrophils Relative %: 43 %
Platelets: 224 10*3/uL (ref 150–400)
RBC: 4.54 MIL/uL (ref 3.87–5.11)
RDW: 11.8 % (ref 11.5–15.5)
WBC: 5.4 10*3/uL (ref 4.0–10.5)
nRBC: 0 % (ref 0.0–0.2)

## 2021-01-11 LAB — COMPREHENSIVE METABOLIC PANEL
ALT: 26 U/L (ref 0–44)
AST: 25 U/L (ref 15–41)
Albumin: 4.7 g/dL (ref 3.5–5.0)
Alkaline Phosphatase: 62 U/L (ref 38–126)
Anion gap: 10 (ref 5–15)
BUN: 11 mg/dL (ref 8–23)
CO2: 28 mmol/L (ref 22–32)
Calcium: 9.4 mg/dL (ref 8.9–10.3)
Chloride: 102 mmol/L (ref 98–111)
Creatinine, Ser: 0.71 mg/dL (ref 0.44–1.00)
GFR, Estimated: >60 mL/min (ref >60)
Glucose, Bld: 99 mg/dL (ref 70–99)
Potassium: 4.2 mmol/L (ref 3.5–5.1)
Sodium: 140 mmol/L (ref 135–145)
Total Bilirubin: 0.8 mg/dL (ref 0.3–1.2)
Total Protein: 7.3 g/dL (ref 6.5–8.1)

## 2021-01-11 LAB — LIPID PANEL
Cholesterol: 309 mg/dL — ABNORMAL HIGH (ref 0–200)
HDL: 77 mg/dL (ref >40)
LDL Cholesterol: 216 mg/dL — ABNORMAL HIGH (ref 0–99)
Total CHOL/HDL Ratio: 4 RATIO
Triglycerides: 82 mg/dL (ref <150)
VLDL: 16 mg/dL (ref 0–40)

## 2021-01-11 LAB — VITAMIN D 25 HYDROXY (VIT D DEFICIENCY, FRACTURES): Vit D, 25-Hydroxy: 44.55 ng/mL (ref 30–100)

## 2021-01-11 NOTE — Assessment & Plan Note (Signed)
CPE with no concerning findings today. Will obtain labs for further evaluation.  Recommend physical activity of at least 20 minutes per day and regular diet with low saturated fats and carbohydrates.  Will make changes to plan of care as needed based on laboratory findings.  Follow-up in 1 year for CPE Follow-up in 1 week for PPSV23 vaccine Follow-up in 7-8 weeks for Shingrix vaccine

## 2021-01-11 NOTE — Assessment & Plan Note (Signed)
Will obtain labs today for evaluation.

## 2021-01-11 NOTE — Patient Instructions (Addendum)
Thank you for choosing [place] [facility] for your Primary Care needs. I am excited for the opportunity to partner with you to meet your health care goals. It was a pleasure meeting you today.   For your information, our office hours are Monday- Friday 8:00 AM - 5:00 PM At this time I am not in the office on Wednesdays.  If you have any needs, please feel free to send a MyChart message or call our office at [phone] and we will respond as quickly as possible.   If you had labs drawn today for your visit, you will receive notification on MyChart as soon as the labs have resulted. You will see these results before me. I ask that you please allow 72 business hours for me to view the results. I will send you a MyChart message with lab results and if there are abnormalities that we need to discuss, you will receive a phone call from the office. If you have not heard anything within 72 hours through MyChart or a phone call, please feel free to contact the office to let us know.   I typically respond to MyChart messages within 24 hours during the work week.  Most messages must be routed to me even when the message is selected directly for me. This may cause a delay in my response. Your needs are very important to me, if you need a faster response, please feel free to call the office and a message will be directed to me.  Due to the routing process, messages sent over the weekend may not get to me until Monday.   If you have non-urgent medical needs after hours, please call the office line and speak to our on-call provider. We share on-call services with another [place] office, therefore, you may receive a return call from a provider outside of this office.   If you have urgent medical needs after hours, please seek care in an Urgent Care or Emergency Room. Our building has a 24 hour emergency department on the 1st floor for your convenience.   Please do not  hesitate to reach out to [place] with concerns.   Thank you, again, for choosing me as your health care partner. I appreciate your trust and look forward to learning more about you.   Worthy Keeler, DNP, AGNP-c ____________________________________________________________________________________  Recommendations for routine health maintenance:  Pneumovax (PPSV 23): This vaccine is recommended for all adults over 25 years of age to help protect against 6 of the most common forms of pneumonia.  You are eligible for this vaccine now. You can schedule a nurse visit for this on a Friday.   Shingrix: This vaccine is recommended for all adults over the age of 32 years to help protect against shingles. This is a 2 dose series.  You are eligible for this vaccine now.   Tdap: This vaccine protects against tetanus, which is often found living in dirt and on surfaces and can enter your body through cuts or wounds.  You are due for a booster of this vaccine after 02/01/2023  Colonoscopy: You are do for repeat colonoscopy in 2023  Mammogram: You are do for repeat mammogram after 07/13/2021  DEXA Scan (Bone Density Scan): Your last scan was normal, therefore you can wait up to 15 years for repeat scan unless we find reason to repeat sooner. If you would like to have this done more frequently, we can arrange this for you. Your  last DEXA scan was performed in 2018.  Pap Smear/HPV Testing: You are due after 03/05/2023.  We will check the following labs today: Cholesterol Complete Blood Count Metabolic Panel (Kidney, Liver, Electrolytes) Vitamin D   __ Pneumococcal Polysaccharide Vaccine (PPSV23): What You Need to Know 1. Why get vaccinated? Pneumococcal polysaccharide vaccine (PPSV23) can prevent pneumococcal disease. Pneumococcal disease refers to any illness caused by pneumococcal bacteria. These bacteria can cause many types of illnesses, including pneumonia, which is an infection of the lungs.  Pneumococcal bacteria are one of the most common causes of pneumonia. Besides pneumonia, pneumococcal bacteria can also cause:  Ear infections  Sinus infections  Meningitis (infection of the tissue covering the brain and spinal cord)  Bacteremia (bloodstream infection) Anyone can get pneumococcal disease, but children under 86 years of age, people with certain medical conditions, adults 81 years or older, and cigarette smokers are at the highest risk. Most pneumococcal infections are mild. However, some can result in long-term problems, such as brain damage or hearing loss. Meningitis, bacteremia, and pneumonia caused by pneumococcal disease can be fatal. 2. PPSV23 PPSV23 protects against 23 types of bacteria that cause pneumococcal disease. PPSV23 is recommended for:  All adults 11 years or older,  Anyone 2 years or older with certain medical conditions that can lead to an increased risk for pneumococcal disease. Most people need only one dose of PPSV23. A second dose of PPSV23, and another type of pneumococcal vaccine called PCV13, are recommended for certain high-risk groups. Your health care provider can give you more information. People 65 years or older should get a dose of PPSV23 even if they have already gotten one or more doses of the vaccine before they turned 23. 3. Talk with your health care provider Tell your vaccine provider if the person getting the vaccine:  Has had an allergic reaction after a previous dose of PPSV23, or has any severe, life-threatening allergies. In some cases, your health care provider may decide to postpone PPSV23 vaccination to a future visit. People with minor illnesses, such as a cold, may be vaccinated. People who are moderately or severely ill should usually wait until they recover before getting PPSV23. Your health care provider can give you more information. 4. Risks of a vaccine reaction  Redness or pain where the shot is given, feeling tired,  fever, or muscle aches can happen after PPSV23. People sometimes faint after medical procedures, including vaccination. Tell your provider if you feel dizzy or have vision changes or ringing in the ears. As with any medicine, there is a very remote chance of a vaccine causing a severe allergic reaction, other serious injury, or death. 5. What if there is a serious problem? An allergic reaction could occur after the vaccinated person leaves the clinic. If you see signs of a severe allergic reaction (hives, swelling of the face and throat, difficulty breathing, a fast heartbeat, dizziness, or weakness), call 9-1-1 and get the person to the nearest hospital. For other signs that concern you, call your health care provider. Adverse reactions should be reported to the Vaccine Adverse Event Reporting System (VAERS). Your health care provider will usually file this report, or you can do it yourself. Visit the VAERS website at www.vaers.SamedayNews.es or call 9523226395. VAERS is only for reporting reactions, and VAERS staff do not give medical advice. 6. How can I learn more?  Ask your health care provider.  Call your local or state health department.  Contact the Centers for Disease Control and  Prevention (CDC): ? Call 918-211-2666 (1-800-CDC-INFO) or ? Visit CDC's website at http://hunter.com/ Vaccine Information Statement PPSV23 Vaccine (09/04/2018) This information is not intended to replace advice given to you by your health care provider. Make sure you discuss any questions you have with your health care provider. Document Revised: 06/25/2020 Document Reviewed: 06/25/2020 Elsevier Patient Education  [place].

## 2021-01-11 NOTE — Progress Notes (Signed)
BP 114/80   Pulse 84   Ht 5\' 8"  (1.727 m)   Wt 173 lb (78.5 kg)   LMP 03/11/2012 (Exact Date)   SpO2 98%   BMI 26.30 kg/m    Subjective:    Patient ID: Anne Kelly, female    DOB: 31-Oct-1956, 65 y.o.   MRN: [id number]  HPI: Anne Kelly is a 65 y.o. female presenting on 01/11/2021 for comprehensive medical examination and to establish care with this practice as a new patient. Current medical complaints include:none  She currently lives with: Husband, Clair Gulling, and their 32 year old puppy.  She has 3 children and 4 grandchildren with another due in July.  Her daughter is an Doctor, general practice in [place], [place].  She has one son who is orthopedic surgeon in [place], [place] specializing and shoulder and elbow.  She has one son who lives in [place] and is an Chief Financial Officer.   She has a history of basal cell carcinoma with surgical removal by Dr. Belva Chimes and she sees Dr. Aram Beecham for her dermatology evaluations every 6 months She sees Dr. Sabra Heck for her OBGYN needs She sees Dr. Collene Mares for her GI needs She is seen at the [facility] for her vision exams.   She reports regular vision exams q1-5y: yes She reports regular dental exams q 62m: yes Her diet consists of: overall healthy She endorses exercise and/or activity of: walking with her dog daily, paddle boarding.  She works at: She is [employer] [place]  She endorses ETOH use of glass of wine about once a day She denies nictoine use  She denies illegal substance use    She reports post menopause with no current issues.  Current menopausal symptoms: no She is not currently sexually active  She denies concerns today about STI Contraception choices are: post menopausal.   She denies concerns about skin changes today She denies concerns about bowel changes today She denies concerns about bladder changes today  Depression Screen done today and results listed below:  Depression screen [facility] 2/9 01/03/2021   Decreased Interest 0  Down, Depressed, Hopeless 0  PHQ - 2 Score 0    She does not have a history of falls. I did not complete a risk assessment for falls. A plan of care for falls was not documented.   Past Medical History:  Past Medical History:  Diagnosis Date  . Allergic rhinitis   . Bowel obstruction [facility])     Surgical History:  Past Surgical History:  Procedure Laterality Date  . ABDOMINAL ADHESION SURGERY  2004   intestinal blockage  . FOOT SURGERY Left 1980   cyst from heel  . HYSTEROSCOPY  6/13   D & C, myomectomy X 3  . RECONSTRUCTION OF NOSE  1980    Medications:  Current Outpatient Medications on File Prior to Visit  Medication Sig  . Cobalamin Combinations (B-12) [phone] MCG SUBL   . COLLAGEN PO Take 3 capsules by mouth daily.  Marland Kitchen ELDERBERRY PO Take by mouth. With zinc  . MELATONIN PO Take 1 tablet by mouth at bedtime as needed.  . Multiple Vitamins-Minerals (MULTIVITAMIN WITH MINERALS) tablet Take 1 tablet by mouth daily.  . Omega-3 Fatty Acids (FISH OIL) 1000 MG CAPS   . Probiotic Product (PROBIOTIC PO) Take by mouth.  Marland Kitchen VITAMIN D PO Take by mouth.   No current facility-administered medications on file prior to visit.    Allergies:  Allergies  Allergen Reactions  .  Amoxicillin Hives and Shortness Of Breath  . Codeine Nausea And Vomiting  . Penicillins Itching    Has patient had a PCN reaction causing immediate rash, facial/tongue/throat swelling, SOB or lightheadedness with hypotension: Yes Has patient had a PCN reaction causing severe rash involving mucus membranes or skin necrosis: No Has patient had a PCN reaction that required hospitalization: No Has patient had a PCN reaction occurring within the last 10 years: Yes If all of the above answers are "NO", then may proceed with Cephalosporin use.    Social History:  Social History   Socioeconomic History  . Marital status: Married    Spouse name: Not on file  . Number of children: 3  .  Years of education: Not on file  . Highest education level: Not on file  Occupational History    Employer: [employer]  Tobacco Use  . Smoking status: Never Smoker  . Smokeless tobacco: Never Used  Vaping Use  . Vaping Use: Never used  Substance and Sexual Activity  . Alcohol use: Yes    Alcohol/week: 4.0 standard drinks    Types: 4 Glasses of wine per week  . Drug use: No  . Sexual activity: Yes    Partners: Male    Birth control/protection: Post-menopausal  Other Topics Concern  . Not on file  Social History Narrative  . Not on file   Social Determinants of Health   Financial Resource Strain: Not on file  Food Insecurity: Not on file  Transportation Needs: Not on file  Physical Activity: Not on file  Stress: Not on file  Social Connections: Not on file  Intimate Partner Violence: Not on file   Social History   Tobacco Use  Smoking Status Never Smoker  Smokeless Tobacco Never Used   Social History   Substance and Sexual Activity  Alcohol Use Yes  . Alcohol/week: 4.0 standard drinks  . Types: 4 Glasses of wine per week    Family History:  Family History  Problem Relation Age of Onset  . Hyperlipidemia Mother   . Kidney Stones Mother   . Prostate cancer Father   . Diabetes Father   . Hypertension Father   . Prostate cancer Brother   . Kidney Stones Brother   . Melanoma Brother   . Breast cancer Paternal Grandmother     Past medical history, surgical history, medications, allergies, family history and social history reviewed with patient today and changes made to appropriate areas of the chart.   All ROS negative except what is listed above and in the HPI.      Objective:    BP 114/80   Pulse 84   Ht 5\' 8"  (1.727 m)   Wt 173 lb (78.5 kg)   LMP 03/11/2012 (Exact Date)   SpO2 98%   BMI 26.30 kg/m   Wt Readings from Last 3 Encounters:  01/11/21 173 lb (78.5 kg)  01/03/21 175 lb 6.4 oz (79.6 kg)  09/15/19 177 lb (80.3 kg)     Physical Exam Vitals and nursing note reviewed.  Constitutional:      Appearance: Normal appearance. She is normal weight.  HENT:     Head: Normocephalic.     Right Ear: Tympanic membrane, ear canal and external ear normal.     Left Ear: Tympanic membrane, ear canal and external ear normal.     Mouth/Throat:     Mouth: Mucous membranes are moist.     Pharynx: Oropharynx is clear.  Eyes:  Extraocular Movements: Extraocular movements intact.     Conjunctiva/sclera: Conjunctivae normal.     Pupils: Pupils are equal, round, and reactive to light.  Neck:     Vascular: No carotid bruit.  Cardiovascular:     Rate and Rhythm: Normal rate and regular rhythm.     Pulses: Normal pulses.     Heart sounds: Normal heart sounds.  Pulmonary:     Effort: Pulmonary effort is normal.     Breath sounds: Normal breath sounds.  Abdominal:     General: Abdomen is flat. Bowel sounds are normal. There is no distension.     Palpations: Abdomen is soft.     Tenderness: There is no abdominal tenderness. There is no right CVA tenderness or left CVA tenderness.  Musculoskeletal:        General: Normal range of motion.     Cervical back: Normal range of motion. No rigidity.     Right lower leg: No edema.     Left lower leg: No edema.  Lymphadenopathy:     Cervical: No cervical adenopathy.  Skin:    General: Skin is warm and dry.     Capillary Refill: Capillary refill takes less than 2 seconds.  Neurological:     General: No focal deficit present.     Mental Status: She is alert and oriented to person, place, and time.     Cranial Nerves: No cranial nerve deficit.     Sensory: No sensory deficit.     Motor: No weakness.     Coordination: Coordination normal.     Gait: Gait normal.  Psychiatric:        Mood and Affect: Mood normal.        Behavior: Behavior normal.        Thought Content: Thought content normal.        Judgment: Judgment normal.     Results for orders placed or performed in  visit on 01/03/21  Cytology - PAP( [place])  Result Value Ref Range   Adequacy      Satisfactory for evaluation; transformation zone component PRESENT.   Diagnosis      - Negative for intraepithelial lesion or malignancy (NILM)      Assessment & Plan:   Problem List Items Addressed This Visit   None   Visit Diagnoses    Encounter to establish care    -  Primary   Relevant Orders   CBC with Differential/Platelet (Completed)   Lipid panel   Comprehensive metabolic panel (Completed)   Vitamin D (25 hydroxy)   Laboratory tests ordered as part of a complete physical exam (CPE)       Relevant Orders   CBC with Differential/Platelet (Completed)   Lipid panel   Comprehensive metabolic panel (Completed)   Encounter for annual physical exam       H/O vitamin D deficiency       Relevant Orders   Vitamin D (25 hydroxy)       Follow up plan: Return in about 1 year (around 01/11/2022) for CPE- Please schedule nurse visit on a Friday for PPSV23 vax.   LABORATORY TESTING:  - Pap smear: up to date - STI testing: deferred  IMMUNIZATIONS:   - Tdap: Tetanus vaccination status reviewed: last tetanus booster within 10 years. - Influenza: Refused - Pneumovax: Plan to administer on a Friday in the coming weeks.  - Prevnar: Not applicable - HPV: Not applicable - Zostavax vaccine: Plan to administer in 6-8 weeks.  SCREENING: -Mammogram: Up to date  - Colonoscopy: Up to date  - Bone Density: Up to date  -Hearing Test: Not applicable  -Spirometry: Not applicable     NEXT PREVENTATIVE PHYSICAL DUE IN 1 YEAR. Return in about 1 year (around 01/11/2022) for CPE- Please schedule nurse visit on a Friday for PPSV23 vax.

## 2021-01-12 NOTE — Progress Notes (Signed)
Labs reviewed: LDL elevated- Recommend starting a statin in addition to dietary reduction of saturated fats, cholesterol, and moderation in carbohydrates.   Hemoglobin slightly elevated, but consistent with historic findings. No changes today.  Vitamin D looks great. No changes today.  Kidneys, liver, and blood sugar all within normal range.   Recommend start rosuvastatin 10mg  daily and recheck labs in 12 weeks if she is agreeable.

## 2021-01-13 ENCOUNTER — Telehealth (HOSPITAL_BASED_OUTPATIENT_CLINIC_OR_DEPARTMENT_OTHER): Payer: Self-pay

## 2021-01-13 ENCOUNTER — Encounter (HOSPITAL_BASED_OUTPATIENT_CLINIC_OR_DEPARTMENT_OTHER): Payer: Self-pay | Admitting: Nurse Practitioner

## 2021-01-13 NOTE — Telephone Encounter (Signed)
-----   Message from Orma Render, NP sent at 01/12/2021  4:41 PM EST ----- Labs reviewed: LDL elevated- Recommend starting a statin in addition to dietary reduction of saturated fats, cholesterol, and moderation in carbohydrates.   Hemoglobin slightly elevated, but consistent with historic findings. No changes today.  Vitamin D looks great. No changes today.  Kidneys, liver, and blood sugar all within normal range.   Recommend start rosuvastatin 10mg  daily and recheck labs in 12 weeks if she is agreeable.

## 2021-01-13 NOTE — Telephone Encounter (Signed)
Results released and reviewed by patient via [place] patient to contact office with any questions or concerns.

## 2021-01-14 ENCOUNTER — Encounter (HOSPITAL_BASED_OUTPATIENT_CLINIC_OR_DEPARTMENT_OTHER): Payer: Self-pay

## 2021-01-14 ENCOUNTER — Ambulatory Visit (HOSPITAL_BASED_OUTPATIENT_CLINIC_OR_DEPARTMENT_OTHER): Payer: BC Managed Care – PPO

## 2021-01-17 ENCOUNTER — Telehealth (HOSPITAL_BASED_OUTPATIENT_CLINIC_OR_DEPARTMENT_OTHER): Payer: Self-pay

## 2021-01-17 NOTE — Telephone Encounter (Signed)
Results released by Sarabeth Early, AGNP and reviewed by patient via MyChart. Instructed patient to contact the office with any questions or concerns.  

## 2021-01-17 NOTE — Progress Notes (Signed)
Patient to try herbal/natural lipid lowering medication. Plan to hold off on starting statin today.   Plan to recheck labs in 6 months. Will need scheduled for labs. Follow-up appointment if levels remain elevated.

## 2021-01-17 NOTE — Telephone Encounter (Signed)
-----   Message from Orma Render, NP sent at 01/17/2021  9:34 AM EDT ----- Patient to try herbal/natural lipid lowering medication. Plan to hold off on starting statin today.   Plan to recheck labs in 6 months. Will need scheduled for labs. Follow-up appointment if levels remain elevated.

## 2021-02-09 ENCOUNTER — Encounter (HOSPITAL_BASED_OUTPATIENT_CLINIC_OR_DEPARTMENT_OTHER): Payer: Self-pay

## 2021-02-15 DIAGNOSIS — H524 Presbyopia: Secondary | ICD-10-CM | POA: Diagnosis not present

## 2021-04-06 DIAGNOSIS — Z1283 Encounter for screening for malignant neoplasm of skin: Secondary | ICD-10-CM | POA: Diagnosis not present

## 2021-04-06 DIAGNOSIS — D225 Melanocytic nevi of trunk: Secondary | ICD-10-CM | POA: Diagnosis not present

## 2021-04-06 DIAGNOSIS — B070 Plantar wart: Secondary | ICD-10-CM | POA: Diagnosis not present

## 2021-07-15 ENCOUNTER — Other Ambulatory Visit (HOSPITAL_BASED_OUTPATIENT_CLINIC_OR_DEPARTMENT_OTHER): Payer: BC Managed Care – PPO

## 2021-07-18 DIAGNOSIS — Z1231 Encounter for screening mammogram for malignant neoplasm of breast: Secondary | ICD-10-CM | POA: Diagnosis not present

## 2021-07-20 ENCOUNTER — Encounter (HOSPITAL_BASED_OUTPATIENT_CLINIC_OR_DEPARTMENT_OTHER): Payer: Self-pay | Admitting: Obstetrics & Gynecology

## 2021-08-08 ENCOUNTER — Encounter (HOSPITAL_BASED_OUTPATIENT_CLINIC_OR_DEPARTMENT_OTHER): Payer: Self-pay | Admitting: Obstetrics & Gynecology

## 2021-08-08 DIAGNOSIS — Z780 Asymptomatic menopausal state: Secondary | ICD-10-CM | POA: Diagnosis not present

## 2021-08-16 ENCOUNTER — Encounter (HOSPITAL_BASED_OUTPATIENT_CLINIC_OR_DEPARTMENT_OTHER): Payer: Self-pay | Admitting: Obstetrics & Gynecology

## 2021-10-05 DIAGNOSIS — D2272 Melanocytic nevi of left lower limb, including hip: Secondary | ICD-10-CM | POA: Diagnosis not present

## 2021-10-05 DIAGNOSIS — D225 Melanocytic nevi of trunk: Secondary | ICD-10-CM | POA: Diagnosis not present

## 2021-10-05 DIAGNOSIS — Z1283 Encounter for screening for malignant neoplasm of skin: Secondary | ICD-10-CM | POA: Diagnosis not present

## 2021-10-05 DIAGNOSIS — Z08 Encounter for follow-up examination after completed treatment for malignant neoplasm: Secondary | ICD-10-CM | POA: Diagnosis not present

## 2021-10-05 DIAGNOSIS — Z85828 Personal history of other malignant neoplasm of skin: Secondary | ICD-10-CM | POA: Diagnosis not present

## 2021-10-05 DIAGNOSIS — D485 Neoplasm of uncertain behavior of skin: Secondary | ICD-10-CM | POA: Diagnosis not present

## 2021-10-05 DIAGNOSIS — L989 Disorder of the skin and subcutaneous tissue, unspecified: Secondary | ICD-10-CM | POA: Diagnosis not present

## 2021-11-14 DIAGNOSIS — L720 Epidermal cyst: Secondary | ICD-10-CM | POA: Diagnosis not present

## 2021-11-14 DIAGNOSIS — D485 Neoplasm of uncertain behavior of skin: Secondary | ICD-10-CM | POA: Diagnosis not present

## 2021-11-14 DIAGNOSIS — L988 Other specified disorders of the skin and subcutaneous tissue: Secondary | ICD-10-CM | POA: Diagnosis not present

## 2022-04-05 DIAGNOSIS — D225 Melanocytic nevi of trunk: Secondary | ICD-10-CM | POA: Diagnosis not present

## 2022-04-05 DIAGNOSIS — Z1283 Encounter for screening for malignant neoplasm of skin: Secondary | ICD-10-CM | POA: Diagnosis not present

## 2022-07-24 DIAGNOSIS — Z1231 Encounter for screening mammogram for malignant neoplasm of breast: Secondary | ICD-10-CM | POA: Diagnosis not present

## 2022-07-27 ENCOUNTER — Encounter (HOSPITAL_BASED_OUTPATIENT_CLINIC_OR_DEPARTMENT_OTHER): Payer: Self-pay | Admitting: *Deleted

## 2022-10-04 DIAGNOSIS — Z1283 Encounter for screening for malignant neoplasm of skin: Secondary | ICD-10-CM | POA: Diagnosis not present

## 2022-10-04 DIAGNOSIS — D225 Melanocytic nevi of trunk: Secondary | ICD-10-CM | POA: Diagnosis not present

## 2023-02-19 DIAGNOSIS — L720 Epidermal cyst: Secondary | ICD-10-CM | POA: Diagnosis not present

## 2023-02-19 DIAGNOSIS — L820 Inflamed seborrheic keratosis: Secondary | ICD-10-CM | POA: Diagnosis not present

## 2023-03-02 ENCOUNTER — Encounter (HOSPITAL_COMMUNITY): Payer: Self-pay

## 2023-03-02 ENCOUNTER — Other Ambulatory Visit: Payer: Self-pay

## 2023-03-02 ENCOUNTER — Inpatient Hospital Stay (HOSPITAL_COMMUNITY)
Admission: EM | Admit: 2023-03-02 | Discharge: 2023-03-05 | DRG: 813 | Disposition: A | Discharge status: Home / Self Care | Payer: BC Managed Care – PPO | Attending: Family Medicine | Admitting: Family Medicine

## 2023-03-02 DIAGNOSIS — Z885 Allergy status to narcotic agent status: Secondary | ICD-10-CM

## 2023-03-02 DIAGNOSIS — R519 Headache, unspecified: Secondary | ICD-10-CM | POA: Diagnosis not present

## 2023-03-02 DIAGNOSIS — Z83438 Family history of other disorder of lipoprotein metabolism and other lipidemia: Secondary | ICD-10-CM | POA: Diagnosis not present

## 2023-03-02 DIAGNOSIS — Z808 Family history of malignant neoplasm of other organs or systems: Secondary | ICD-10-CM | POA: Diagnosis not present

## 2023-03-02 DIAGNOSIS — L0292 Furuncle, unspecified: Secondary | ICD-10-CM | POA: Diagnosis not present

## 2023-03-02 DIAGNOSIS — D696 Thrombocytopenia, unspecified: Secondary | ICD-10-CM | POA: Diagnosis not present

## 2023-03-02 DIAGNOSIS — Z79899 Other long term (current) drug therapy: Secondary | ICD-10-CM | POA: Diagnosis not present

## 2023-03-02 DIAGNOSIS — Z8042 Family history of malignant neoplasm of prostate: Secondary | ICD-10-CM

## 2023-03-02 DIAGNOSIS — K068 Other specified disorders of gingiva and edentulous alveolar ridge: Secondary | ICD-10-CM | POA: Diagnosis present

## 2023-03-02 DIAGNOSIS — D693 Immune thrombocytopenic purpura: Secondary | ICD-10-CM | POA: Diagnosis not present

## 2023-03-02 DIAGNOSIS — Z833 Family history of diabetes mellitus: Secondary | ICD-10-CM

## 2023-03-02 DIAGNOSIS — Z803 Family history of malignant neoplasm of breast: Secondary | ICD-10-CM | POA: Diagnosis not present

## 2023-03-02 DIAGNOSIS — Z8249 Family history of ischemic heart disease and other diseases of the circulatory system: Secondary | ICD-10-CM | POA: Diagnosis not present

## 2023-03-02 DIAGNOSIS — Z880 Allergy status to penicillin: Secondary | ICD-10-CM | POA: Diagnosis not present

## 2023-03-02 LAB — PREPARE PLATELET PHERESIS

## 2023-03-02 LAB — CBC WITH DIFFERENTIAL/PLATELET
Abs Immature Granulocytes: 0.03 10*3/uL (ref 0.00–0.07)
Basophils Absolute: 0 10*3/uL (ref 0.0–0.1)
Basophils Relative: 1 %
Eosinophils Absolute: 0.2 10*3/uL (ref 0.0–0.5)
Eosinophils Relative: 3 %
HCT: 44.2 % (ref 36.0–46.0)
Hemoglobin: 15.5 g/dL — ABNORMAL HIGH (ref 12.0–15.0)
Immature Granulocytes: 0 %
Lymphocytes Relative: 30 %
Lymphs Abs: 2.1 10*3/uL (ref 0.7–4.0)
MCH: 34.7 pg — ABNORMAL HIGH (ref 26.0–34.0)
MCHC: 35.1 g/dL (ref 30.0–36.0)
MCV: 98.9 fL (ref 80.0–100.0)
Monocytes Absolute: 0.5 10*3/uL (ref 0.1–1.0)
Monocytes Relative: 7 %
Neutro Abs: 4 10*3/uL (ref 1.7–7.7)
Neutrophils Relative %: 59 %
Platelets: <5 10*3/uL — CL (ref 150–400)
RBC: 4.47 MIL/uL (ref 3.87–5.11)
RDW: 11.5 % (ref 11.5–15.5)
WBC: 6.8 10*3/uL (ref 4.0–10.5)
nRBC: 0 % (ref 0.0–0.2)

## 2023-03-02 LAB — COMPREHENSIVE METABOLIC PANEL
ALT: 29 U/L (ref 0–44)
AST: 26 U/L (ref 15–41)
Albumin: 4.5 g/dL (ref 3.5–5.0)
Alkaline Phosphatase: 74 U/L (ref 38–126)
Anion gap: 12 (ref 5–15)
BUN: 12 mg/dL (ref 8–23)
CO2: 22 mmol/L (ref 22–32)
Calcium: 9.2 mg/dL (ref 8.9–10.3)
Chloride: 103 mmol/L (ref 98–111)
Creatinine, Ser: 0.62 mg/dL (ref 0.44–1.00)
GFR, Estimated: >60 mL/min (ref >60)
Glucose, Bld: 108 mg/dL — ABNORMAL HIGH (ref 70–99)
Potassium: 4.1 mmol/L (ref 3.5–5.1)
Sodium: 137 mmol/L (ref 135–145)
Total Bilirubin: 0.8 mg/dL (ref 0.3–1.2)
Total Protein: 7.5 g/dL (ref 6.5–8.1)

## 2023-03-02 LAB — ABO/RH: ABO/RH(D): O POS

## 2023-03-02 LAB — BPAM PLATELET PHERESIS
Blood Product Expiration Date: 202404272359
ISSUE DATE / TIME: 202404261932
Unit Type and Rh: 6200

## 2023-03-02 LAB — TYPE AND SCREEN
ABO/RH(D): O POS
Antibody Screen: NEGATIVE

## 2023-03-02 MED ORDER — ALBUTEROL SULFATE (2.5 MG/3ML) 0.083% IN NEBU: 2.5000 mg | INHALATION_SOLUTION | RESPIRATORY_TRACT | Status: DC | PRN

## 2023-03-02 MED ORDER — ONDANSETRON HCL 4 MG PO TABS: 4.0000 mg | ORAL_TABLET | Freq: Four times a day (QID) | ORAL | Status: DC | PRN

## 2023-03-02 MED ORDER — ACETAMINOPHEN 325 MG PO TABS
650.0000 mg | ORAL_TABLET | Freq: Four times a day (QID) | ORAL | Status: DC | PRN
  Administered 2023-03-02 – 2023-03-05 (×8): 650 mg via ORAL
  Filled 2023-03-02 (×8): qty 2

## 2023-03-02 MED ORDER — DEXAMETHASONE 4 MG PO TABS: 40.0000 mg | ORAL_TABLET | Freq: Every day | ORAL | Status: DC

## 2023-03-02 MED ORDER — IMMUNE GLOBULIN (HUMAN) 10 GM/100ML IV SOLN
1.0000 g/kg | INTRAVENOUS | Status: AC
  Administered 2023-03-03 – 2023-03-04 (×2): 65 g via INTRAVENOUS
  Filled 2023-03-02 (×2): qty 650

## 2023-03-02 MED ORDER — TRAZODONE HCL 50 MG PO TABS
25.0000 mg | ORAL_TABLET | Freq: Every evening | ORAL | Status: DC | PRN
  Administered 2023-03-02 – 2023-03-05 (×3): 25 mg via ORAL
  Filled 2023-03-02 (×3): qty 1

## 2023-03-02 MED ORDER — SODIUM CHLORIDE 0.9% IV SOLUTION: Freq: Once | INTRAVENOUS | Status: AC

## 2023-03-02 MED ORDER — SENNOSIDES-DOCUSATE SODIUM 8.6-50 MG PO TABS: 1.0000 | ORAL_TABLET | Freq: Every evening | ORAL | Status: DC | PRN

## 2023-03-02 MED ORDER — ONDANSETRON HCL 4 MG/2ML IJ SOLN
4.0000 mg | Freq: Four times a day (QID) | INTRAMUSCULAR | Status: DC | PRN
  Administered 2023-03-03 – 2023-03-04 (×5): 4 mg via INTRAVENOUS
  Filled 2023-03-02 (×5): qty 2

## 2023-03-02 MED ORDER — HYDROCODONE-ACETAMINOPHEN 5-325 MG PO TABS
1.0000 | ORAL_TABLET | ORAL | Status: DC | PRN
  Administered 2023-03-02 – 2023-03-05 (×3): 1 via ORAL
  Filled 2023-03-02 (×4): qty 1

## 2023-03-02 MED ORDER — DEXAMETHASONE 4 MG PO TABS
40.0000 mg | ORAL_TABLET | Freq: Every day | ORAL | Status: DC
  Administered 2023-03-02 – 2023-03-03 (×2): 40 mg via ORAL
  Filled 2023-03-02 (×4): qty 10

## 2023-03-02 MED ORDER — ACETAMINOPHEN 650 MG RE SUPP: 650.0000 mg | Freq: Four times a day (QID) | RECTAL | Status: DC | PRN

## 2023-03-02 NOTE — ED Notes (Addendum)
ED TO INPATIENT HANDOFF REPORT  ED Nurse Name and Phone #: Elnita Maxwell [phone]  S Name/Age/Gender Anne Kelly 67 y.o. female Room/Bed: WA21/WA21  Code Status   Code Status: Full Code  Home/SNF/Other Home Patient oriented to: self, place, time, and situation Is this baseline? Yes   Triage Complete: Triage complete  Chief Complaint Thrombocytopenia (HCC) [D69.6]  Triage Note C/o rash to right lower leg and stopped taking abx yesterday due to rash.  Benadryl and ibuprofen yesterday w/o relief.  Boil removed on 4/15 and placed on bactium x 10days.  Also c/o multiple bruising and nose bleeds.  Denies blood thinner usage.    Allergies Allergies  Allergen Reactions   Amoxicillin Hives and Shortness Of Breath   Codeine Nausea And Vomiting   Penicillins Itching and Rash    Level of Care/Admitting Diagnosis ED Disposition     ED Disposition  Admit   Condition  --   Comment  Hospital Area: [facility] [[id number]]  Level of Care: Med-Surg [16]  May place patient in observation at [facility] or [facility] if equivalent level of care is available:: No  Covid Evaluation: Asymptomatic - no recent exposure (last 10 days) testing not required  Diagnosis: Thrombocytopenia [facility]) [[id number]]  Admitting Physician: Maryln Gottron [[id number]]  Attending Physician: Kirby Crigler, MIR [email or web address] [[id number]]          B Medical/Surgery History Past Medical History:  Diagnosis Date   Allergic rhinitis    Bad headache    Bowel obstruction (HCC)    Dehydration 11/15/2017   Hypokalemia 11/15/2017   Hyponatremia 11/14/2017   Insomnia 11/15/2017   Viral gastroenteritis 11/15/2017   Past Surgical History:  Procedure Laterality Date   ABDOMINAL ADHESION SURGERY  2004   intestinal blockage   FOOT SURGERY Left 1980   cyst from heel   HYSTEROSCOPY  6/13   D & C, myomectomy X 3   RECONSTRUCTION OF NOSE  1980     A IV Location/Drains/Wounds Patient Lines/Drains/Airways Status      Active Line/Drains/Airways     Name Placement date Placement time Site Days   Peripheral IV 03/02/23 20 G Anterior;Right Forearm 03/02/23  1556  Forearm  less than 1   Incision 04/08/12 Perineum Other (Comment) 04/08/12  0740  -- 3980            Intake/Output Last 24 hours  Intake/Output Summary (Last 24 hours) at 03/02/2023 2203 Last data filed at 03/02/2023 1918 Gross per 24 hour  Intake 289 ml  Output --  Net 289 ml    Labs/Imaging Results for orders placed or performed during the hospital encounter of 03/02/23 (from the past 48 hour(s))  Comprehensive metabolic panel     Status: Abnormal   Collection Time: 03/02/23  2:13 PM  Result Value Ref Range   Sodium 137 135 - 145 mmol/L   Potassium 4.1 3.5 - 5.1 mmol/L   Chloride 103 98 - 111 mmol/L   CO2 22 22 - 32 mmol/L   Glucose, Bld 108 (H) 70 - 99 mg/dL    Comment: Glucose reference range applies only to samples taken after fasting for at least 8 hours.   BUN 12 8 - 23 mg/dL   Creatinine, Ser 9.56 0.44 - 1.00 mg/dL   Calcium 9.2 8.9 - 21.3 mg/dL   Total Protein 7.5 6.5 - 8.1 g/dL   Albumin 4.5 3.5 - 5.0 g/dL   AST 26 15 - 41 U/L   ALT 29 0 - 44  U/L   Alkaline Phosphatase 74 38 - 126 U/L   Total Bilirubin 0.8 0.3 - 1.2 mg/dL   GFR, Estimated >57 >84 mL/min    Comment: (NOTE) Calculated using the CKD-EPI Creatinine Equation (2021)    Anion gap 12 5 - 15    Comment: Performed at [facility], [address] [place], [place] [address]  CBC with Differential     Status: Abnormal   Collection Time: 03/02/23  2:13 PM  Result Value Ref Range   WBC 6.8 4.0 - 10.5 K/uL   RBC 4.47 3.87 - 5.11 MIL/uL   Hemoglobin 15.5 (H) 12.0 - 15.0 g/dL   HCT 52.8 41.3 - 24.4 %   MCV 98.9 80.0 - 100.0 fL   MCH 34.7 (H) 26.0 - 34.0 pg   MCHC 35.1 30.0 - 36.0 g/dL   RDW 01.0 27.2 - 53.6 %   Platelets <5 (LL) 150 - 400 K/uL    Comment: This critical result has verified and been called to [facility], M. RN by Patrina Levering on 04 26 2024 at 1457, and has been read back. RESULT CHECKED. RECOMMEND RECOLLECT TO VERIFY RESULT.   nRBC 0.0 0.0 - 0.2 %   Neutrophils Relative % 59 %   Neutro Abs 4.0 1.7 - 7.7 K/uL   Lymphocytes Relative 30 %   Lymphs Abs 2.1 0.7 - 4.0 K/uL   Monocytes Relative 7 %   Monocytes Absolute 0.5 0.1 - 1.0 K/uL   Eosinophils Relative 3 %   Eosinophils Absolute 0.2 0.0 - 0.5 K/uL   Basophils Relative 1 %   Basophils Absolute 0.0 0.0 - 0.1 K/uL   Immature Granulocytes 0 %   Abs Immature Granulocytes 0.03 0.00 - 0.07 K/uL    Comment: Performed at [facility], [address] [place], [place] [address]  ABO/Rh     Status: None   Collection Time: 03/02/23  3:28 PM  Result Value Ref Range   ABO/RH(D)      O POS Performed at [facility], [address] [place], [place] [address]   Type and screen [facility]     Status: None   Collection Time: 03/02/23  3:39 PM  Result Value Ref Range   ABO/RH(D) O POS    Antibody Screen NEG    Sample Expiration      03/05/2023,2359 Performed at [facility], [address], [place], [place] [address]   Prepare platelet pheresis     Status: None (Preliminary result)   Collection Time: 03/02/23  4:00 PM  Result Value Ref Range   Unit Number [id number]    Blood Component Type PLTP1 PSORALEN TREATED    Unit division 00    Status of Unit ISSUED    Transfusion Status OK TO TRANSFUSE    Unit Number [id number]    Blood Component Type PLTP1 PSORALEN TREATED    Unit division 00    Status of Unit ISSUED    Transfusion Status      OK TO TRANSFUSE Performed at [facility], [address] [place], [place] [address]    No results found.  Pending Labs Unresulted Labs (From admission, onward)     Start     Ordered   03/03/23 0500  CBC with Differential/Platelet  Daily,   R      03/02/23 1627   03/03/23 0500  Basic metabolic panel  Tomorrow  morning,   R  03/02/23 1646   03/03/23 0500  CBC  Tomorrow morning,   R        03/02/23 1646   03/02/23 1529  Technologist smear review  (Technologist smear review)  Once,   STAT       Question:  Clinical information:  Answer:  confirm low platelets   03/02/23 1528   03/02/23 1528  ANA w/Reflex if Positive  Once,   URGENT        03/02/23 1527   03/02/23 1528  Sedimentation rate  Once,   URGENT        03/02/23 1527   03/02/23 1528  CBC with Differential/Platelet  Once,   STAT        03/02/23 1528   03/02/23 1527  Vitamin B12  Once,   URGENT        03/02/23 1527   03/02/23 1527  Hepatitis C antibody  Once,   URGENT        03/02/23 1527   03/02/23 1527  HIV Antibody (routine testing w rflx)  (HIV Antibody (Routine testing w reflex) panel)  Once,   URGENT        03/02/23 1527            Vitals/Pain Today's Vitals   03/02/23 1945 03/02/23 1958 03/02/23 2149 03/02/23 2150  BP: (!) 144/93 (!) 136/98 (!) 148/89 (!) 148/89  Pulse: 92 93 79 79  Resp:  16  18  Temp:  (!) 97.5 F (36.4 C)  97.7 F (36.5 C)  TempSrc:  Oral  Oral  SpO2: 94% 94% 94%   Weight:      Height:      PainSc:        Isolation Precautions No active isolations  Medications Medications  Immune Globulin 10% (PRIVIGEN) IV infusion 65 g (has no administration in time range)  dexamethasone (DECADRON) tablet 40 mg (has no administration in time range)  acetaminophen (TYLENOL) tablet 650 mg (650 mg Oral Given 03/02/23 1842)    Or  acetaminophen (TYLENOL) suppository 650 mg ( Rectal See Alternative 03/02/23 1842)  traZODone (DESYREL) tablet 25 mg (has no administration in time range)  senna-docusate (Senokot-S) tablet 1 tablet (has no administration in time range)  ondansetron (ZOFRAN) tablet 4 mg (has no administration in time range)    Or  ondansetron (ZOFRAN) injection 4 mg (has no administration in time range)  albuterol (PROVENTIL) (2.5 MG/3ML) 0.083% nebulizer solution 2.5 mg (has no administration  in time range)  0.9 %  sodium chloride infusion (Manually program via Guardrails IV Fluids) (0 mLs Intravenous Stopped 03/02/23 2152)    Mobility walks      R Recommendations: See Admitting Provider Note  Report given to: Providence Crosby, RN  Additional Notes: Very pleasant lady, husband at bedside, pt walks to BR w/o assistance, received 2 units of plasma, handle it well, no s/s or transfusion reaction. Pt has bruising and petechiae generalized.

## 2023-03-02 NOTE — H&P (Signed)
History and Physical  Anne Kelly ZOX:[id number] DOB: 07-24-1956 DOA: 03/02/2023  PCP: Sigmund Hazel, MD   Chief Complaint: Rash   HPI: Anne Kelly is a 67 y.o. female without significant past medical history being admitted to the hospital with severe thrombocytopenia.  She was started on Bactrim about 10 days ago by her dermatologist due to a boil on her back, this has improved but yesterday she started noticing a rash on her legs.  She went to see her dermatologist today and was immediately told to come to the emergency department for suspected thrombocytopenia.  She denies any pain, fevers, chills, nausea, vomiting or any other complications.  ED Course: She was evaluated in the emergency department, lab work revealed platelet count less than 5000.  ER provider discussed with on-call hematologist Dr. Bertis Ruddy, who has already seen the patient in the ER.  Review of Systems: Please see HPI for pertinent positives and negatives. A complete 10 system review of systems are otherwise negative.  Past Medical History:  Diagnosis Date   Allergic rhinitis    Bad headache    Bowel obstruction (HCC)    Dehydration 11/15/2017   Hypokalemia 11/15/2017   Hyponatremia 11/14/2017   Insomnia 11/15/2017   Viral gastroenteritis 11/15/2017   Past Surgical History:  Procedure Laterality Date   ABDOMINAL ADHESION SURGERY  2004   intestinal blockage   FOOT SURGERY Left 1980   cyst from heel   HYSTEROSCOPY  6/13   D & C, myomectomy X 3   RECONSTRUCTION OF NOSE  1980    Social History:  reports that she has never smoked. She has never used smokeless tobacco. She reports current alcohol use of about 4.0 standard drinks of alcohol per week. She reports that she does not use drugs.   Allergies  Allergen Reactions   Amoxicillin Hives and Shortness Of Breath   Codeine Nausea And Vomiting   Penicillins Itching and Rash    Family History  Problem Relation Age of Onset   Hyperlipidemia Mother    Kidney  Stones Mother    Prostate cancer Father    Diabetes Father    Hypertension Father    Prostate cancer Brother    Kidney Stones Brother    Melanoma Brother    Breast cancer Paternal Grandmother      Prior to Admission medications   Medication Sig Start Date End Date Taking? Authorizing Provider  Cholecalciferol (VITAMIN D3) 1000 units CAPS Take 1,000 Units by mouth daily.   Yes [provider]  Collagen-Vitamin C (COLLAGEN PLUS VITAMIN C PO) Take 2 tablets by mouth See admin instructions. Take 2 tablets by mouth every other morning   Yes [provider]  diphenhydrAMINE (BENADRYL) 25 MG tablet Take 25 mg by mouth every 6 (six) hours as needed (for suspected allergic reactions or rashes).   Yes [provider]  ELDERBERRY PO Take 2 tablets by mouth See admin instructions. Elderberry with zinc- Chew 2 gummies by mouth once a day   Yes [provider]  Ibuprofen 200 MG CAPS Take 400 mg by mouth every 6 (six) hours as needed (for pain or headaches).   Yes [provider]  Multiple Vitamins-Minerals (ONE-A-DAY WOMENS PO) Take 1 tablet by mouth daily with breakfast.   Yes [provider]  Plant Sterols and Stanols (CHOLEST OFF PO) Take 2 capsules by mouth See admin instructions. CholestOff Plus Softgels- Take 2 softgels by mouth every other morning   Yes [provider]  Probiotic Product (  PROBIOTIC PO) Take 1 capsule by mouth in the morning.   Yes [provider]  RESVERATROL PO Take 1 capsule by mouth daily.   Yes [provider]  TYLENOL 500 MG tablet Take 500-1,000 mg by mouth every 6 (six) hours as needed for mild pain or headache.   Yes [provider]  Omega-3 Fatty Acids (FISH OIL) 1000 MG CAPS Take 1,000 mg by mouth daily. 01/05/20   [provider]  sulfamethoxazole-trimethoprim (BACTRIM DS) 800-160 MG tablet Take 1 tablet by mouth 2 (two) times daily. Patient not taking: Reported on 03/02/2023  02/19/23 03/05/23  [provider]    Physical Exam: BP (!) 149/98 (BP Location: Left Arm)   Pulse 94   Temp 98 F (36.7 C) (Oral)   Resp 18   Ht 5\' 8"  (1.727 m)   Wt 77.1 kg   LMP 03/11/2012 (Exact Date)   SpO2 99%   BMI 25.85 kg/m   General:  Alert, oriented, calm, in no acute distress  Eyes: EOMI, clear conjuctivae, white sclerea Neck: supple, no masses, trachea mildline  Cardiovascular: RRR, no murmurs or rubs, no peripheral edema  Respiratory: clear to auscultation bilaterally, no wheezes, no crackles  Abdomen: soft, nontender, nondistended, normal bowel tones heard  Skin: dry, she diffusely has petechia, especially in her lower extremities.  She has some intraoral petechia and blood blisters as well.  The boil on her back seems to be well-healed, does not look to be actively infected at the moment.  Musculoskeletal: no joint effusions, normal range of motion  Psychiatric: appropriate affect, normal speech  Neurologic: extraocular muscles intact, clear speech, moving all extremities with intact sensorium            Labs on Admission:  Basic Metabolic Panel: Recent Labs  Lab 03/02/23 1413  NA 137  K 4.1  CL 103  CO2 22  GLUCOSE 108*  BUN 12  CREATININE 0.62  CALCIUM 9.2   Liver Function Tests: Recent Labs  Lab 03/02/23 1413  AST 26  ALT 29  ALKPHOS 74  BILITOT 0.8  PROT 7.5  ALBUMIN 4.5   No results for input(s): "LIPASE", "AMYLASE" in the last 168 hours. No results for input(s): "AMMONIA" in the last 168 hours. CBC: Recent Labs  Lab 03/02/23 1413  WBC 6.8  NEUTROABS 4.0  HGB 15.5*  HCT 44.2  MCV 98.9  PLT <5*   Cardiac Enzymes: No results for input(s): "CKTOTAL", "CKMB", "CKMBINDEX", "TROPONINI" in the last 168 hours.  BNP (last 3 results) No results for input(s): "BNP" in the last 8760 hours.  ProBNP (last 3 results) No results for input(s): "PROBNP" in the last 8760 hours.  CBG: No results for input(s): "GLUCAP" in the last  168 hours.  Radiological Exams on Admission: No results found.  Assessment/Plan Principal Problem:   Thrombocytopenia (HCC)-unclear etiology but could be related to Bactrim versus immune thrombocytopenia, versus other -Observation admission -Platelet transfusion and workup per hematologist Dr. Bertis Ruddy -P.o. Decadron daily, and IVIG today and tomorrow  DVT prophylaxis: None   Full Code  Consults called: Oncology  Admission status: Observation   Time spent: 46 minutes  Nyiesha Beever Sharlette Dense MD [facility] Pager [phone]  If 7PM-7AM, please contact night-coverage [email or web address] Password [facility]  03/02/2023, 4:43 PM

## 2023-03-02 NOTE — Consult Note (Signed)
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 although her history is suggestive of "reaction" to Bactrim Additional labs are pending She agrees for platelet transfusion, arranged by ER physician  I suggest pulse dose dexamethasone and IVIG over the weekend I anticipate great response to platelet transfusion but would not recommend discharge unless her platelet count normalizes over the weekend The risks, benefits, side-effects of IVIG and steroids are discussed and she agrees to proceed I recommend platelet transfusion if active bleeding or less than 10,000  Discharge planning She has life-threatening condition requiring admission I anticipate she will be here 3-4 days  I will return to check on her on Monday Please call consult service if questions arise I discussed this with her daughter over the phone  All questions were answered. The patient knows to call the clinic with any problems, questions or concerns. No barriers to learning was detected. The total time spent in the appointment was 80 minutes encounter with patients including review of chart and various tests results, discussions about plan of care and coordination of care plan  Artis Delay, MD 03/02/2023 4:37 PM  CHIEF COMPLAINTS/PURPOSE OF CONSULTATION:  Acute thrombocytopenia  HISTORY OF PRESENTING ILLNESS:  Anne Kelly 67 y.o. female is here because of thrombocytopenia. She was found to have abnormal CBC and diffuse petechiae rash She was seen by her dermatologist due to a boil on her back She was prescribed Bactrim on 4/15 She has mild diffuse bone aches and rash 2 days ago This morning, she noted gum bleeding and bruising When she went back to the dermatology office, she was directed to the ER immediately She denies spontaneous epistaxis, hematuria, melena or hematochezia  The  patient denies history of liver disease, exposure to heparin, history of cardiac murmur/prior cardiovascular surgery or recent new medications She denies prior blood or platelet transfusions No recent weight loss, fever or chills  MEDICAL HISTORY:  Past Medical History:  Diagnosis Date   Allergic rhinitis    Bad headache    Bowel obstruction (HCC)    Dehydration 11/15/2017   Hypokalemia 11/15/2017   Hyponatremia 11/14/2017   Insomnia 11/15/2017   Viral gastroenteritis 11/15/2017    SURGICAL HISTORY: Past Surgical History:  Procedure Laterality Date   ABDOMINAL ADHESION SURGERY  2004   intestinal blockage   FOOT SURGERY Left 1980   cyst from heel   HYSTEROSCOPY  6/13   D & C, myomectomy X 3   RECONSTRUCTION OF NOSE  1980    SOCIAL HISTORY: Social History   Socioeconomic History   Marital status: Married    Spouse name: Not on file   Number of children: 3   Years of education: Not on file   Highest education level: Not on file  Occupational History    Employer: [employer]  Tobacco Use   Smoking status: Never   Smokeless tobacco: Never  Vaping Use   Vaping Use: Never used  Substance and Sexual Activity   Alcohol use: Yes    Alcohol/week: 4.0 standard drinks of alcohol    Types: 4 Glasses of wine per week   Drug use: No   Sexual activity: Yes    Partners: Male    Birth control/protection: Post-menopausal  Other Topics Concern   Not on file  Social History Narrative   Not on file   Social Determinants of Health  Financial Resource Strain: Not on file  Food Insecurity: Not on file  Transportation Needs: Not on file  Physical Activity: Not on file  Stress: Not on file  Social Connections: Not on file  Intimate Partner Violence: Not on file    FAMILY HISTORY: Family History  Problem Relation Age of Onset   Hyperlipidemia Mother    Kidney Stones Mother    Prostate cancer Father    Diabetes Father    Hypertension Father    Prostate  cancer Brother    Kidney Stones Brother    Melanoma Brother    Breast cancer Paternal Grandmother     ALLERGIES:  is allergic to amoxicillin, codeine, and penicillins.  MEDICATIONS:  Current Facility-Administered Medications  Medication Dose Route Frequency Provider Last Rate Last Admin   0.9 %  sodium chloride infusion (Manually program via Guardrails IV Fluids)   Intravenous Once Linwood Dibbles, MD       dexamethasone (DECADRON) tablet 40 mg  40 mg Oral Daily Lennyn Gange, MD       Immune Globulin 10% (PRIVIGEN) IV infusion 65 g  1 g/kg (Ideal) Intravenous Q24 Hr x 2 Juanell Saffo, MD       Current Outpatient Medications  Medication Sig Dispense Refill   Cholecalciferol (VITAMIN D3) 1000 units CAPS Take 1,000 Units by mouth daily.     Collagen-Vitamin C (COLLAGEN PLUS VITAMIN C PO) Take 2 tablets by mouth See admin instructions. Take 2 tablets by mouth every other morning     diphenhydrAMINE (BENADRYL) 25 MG tablet Take 25 mg by mouth every 6 (six) hours as needed (for suspected allergic reactions or rashes).     ELDERBERRY PO Take 2 tablets by mouth See admin instructions. Elderberry with zinc- Chew 2 gummies by mouth once a day     Ibuprofen 200 MG CAPS Take 400 mg by mouth every 6 (six) hours as needed (for pain or headaches).     Multiple Vitamins-Minerals (ONE-A-DAY WOMENS PO) Take 1 tablet by mouth daily with breakfast.     Plant Sterols and Stanols (CHOLEST OFF PO) Take 2 capsules by mouth See admin instructions. CholestOff Plus Softgels- Take 2 softgels by mouth every other morning     Probiotic Product (PROBIOTIC PO) Take 1 capsule by mouth in the morning.     RESVERATROL PO Take 1 capsule by mouth daily.     TYLENOL 500 MG tablet Take 500-1,000 mg by mouth every 6 (six) hours as needed for mild pain or headache.     Omega-3 Fatty Acids (FISH OIL) 1000 MG CAPS Take 1,000 mg by mouth daily.     sulfamethoxazole-trimethoprim (BACTRIM DS) 800-160 MG tablet Take 1 tablet by mouth 2 (two)  times daily. (Patient not taking: Reported on 03/02/2023)      REVIEW OF SYSTEMS:   Constitutional: Denies fevers, chills or abnormal night sweats Eyes: Denies blurriness of vision, double vision or watery eyes Ears, nose, mouth, throat, and face: Denies mucositis or sore throat Respiratory: Denies cough, dyspnea or wheezes Cardiovascular: Denies palpitation, chest discomfort or lower extremity swelling Gastrointestinal:  Denies nausea, heartburn or change in bowel habits Lymphatics: Denies new lymphadenopathy or easy bruising Neurological:Denies numbness, tingling or new weaknesses Behavioral/Psych: Mood is stable, no new changes  All other systems were reviewed with the patient and are negative.  PHYSICAL EXAMINATION: ECOG PERFORMANCE STATUS: 0 - Asymptomatic  Vitals:   03/02/23 1310  BP: (!) 149/98  Pulse: 94  Resp: 18  Temp: 98 F (36.7 C)  SpO2: 99%   Filed Weights   03/02/23 1310  Weight: 170 lb (77.1 kg)    GENERAL:alert, no distress and comfortable SKIN: She has diffuse petechiae rash and oral blisters EYES: normal, conjunctiva are pink and non-injected, sclera clear OROPHARYNX:mucosal ecchymoses are noted NECK: supple, thyroid normal size, non-tender, without nodularity LYMPH:  no palpable lymphadenopathy in the cervical, axillary or inguinal LUNGS: clear to auscultation and percussion with normal breathing effort HEART: regular rate & rhythm and no murmurs and no lower extremity edema ABDOMEN:abdomen soft, non-tender and normal bowel sounds Musculoskeletal:no cyanosis of digits and no clubbing  PSYCH: alert & oriented x 3 with fluent speech NEURO: no focal motor/sensory deficits  LABORATORY DATA:  I have reviewed the data as listed Lab Results  Component Value Date   WBC 6.8 03/02/2023   HGB 15.5 (H) 03/02/2023   HCT 44.2 03/02/2023   MCV 98.9 03/02/2023   PLT <5 (LL) 03/02/2023     I have reviewed her smear. No platelets are seen. Normal WBC and rbc  morphology, a few reactive looking lymphs

## 2023-03-02 NOTE — ED Notes (Signed)
Blood bank has platelets ready for this patient.  Notified Lindsey,RN.

## 2023-03-02 NOTE — ED Provider Notes (Signed)
[place] EMERGENCY DEPARTMENT AT [facility] Provider Note   CSN: [id number] Arrival date & time: 03/02/23  1251     History  Chief Complaint  Patient presents with   Rash    Anne Kelly is a 67 y.o. female.   Rash    Patient states she recently had seen a dermatologist and was started on Bactrim.  Yesterday she noticed that she started developing a rash on her legs.  She stopped taking the antibiotic but the rash increased.  She sent a message to the dermatologist office initially and recommended Benadryl.  Patient then went and saw the doctor today who told her to come to immediately to the emergency room as they were concerned about her platelets.  Patient has noticed this rash throughout her body now significant in her lower extremities.  She is also noticed some lesions in her mouth.  She is feeling achy but has not noticed any fevers.  She has not had any blood in her stool.  She has had some bruising and intermittent nosebleeds.  Home Medications Prior to Admission medications   Medication Sig Start Date End Date Taking? Authorizing Provider  Cholecalciferol (VITAMIN D3) 1000 units CAPS Take 1,000 Units by mouth daily.   Yes [provider]  Collagen-Vitamin C (COLLAGEN PLUS VITAMIN C PO) Take 2 tablets by mouth See admin instructions. Take 2 tablets by mouth every other morning   Yes [provider]  diphenhydrAMINE (BENADRYL) 25 MG tablet Take 25 mg by mouth every 6 (six) hours as needed (for suspected allergic reactions or rashes).   Yes [provider]  ELDERBERRY PO Take 2 tablets by mouth See admin instructions. Elderberry with zinc- Chew 2 gummies by mouth once a day   Yes [provider]  Ibuprofen 200 MG CAPS Take 400 mg by mouth every 6 (six) hours as needed (for pain or headaches).   Yes [provider]  Multiple Vitamins-Minerals (ONE-A-DAY WOMENS PO) Take 1 tablet by mouth daily with breakfast.   Yes  [provider]  Plant Sterols and Stanols (CHOLEST OFF PO) Take 2 capsules by mouth See admin instructions. CholestOff Plus Softgels- Take 2 softgels by mouth every other morning   Yes [provider]  Probiotic Product (PROBIOTIC PO) Take 1 capsule by mouth in the morning.   Yes [provider]  RESVERATROL PO Take 1 capsule by mouth daily.   Yes [provider]  TYLENOL 500 MG tablet Take 500-1,000 mg by mouth every 6 (six) hours as needed for mild pain or headache.   Yes [provider]  Omega-3 Fatty Acids (FISH OIL) 1000 MG CAPS Take 1,000 mg by mouth daily. 01/05/20   [provider]  sulfamethoxazole-trimethoprim (BACTRIM DS) 800-160 MG tablet Take 1 tablet by mouth 2 (two) times daily. Patient not taking: Reported on 03/02/2023 02/19/23 03/05/23  [provider]      Allergies    Amoxicillin, Codeine, and Penicillins    Review of Systems   Review of Systems  Skin:  Positive for rash.    Physical Exam Updated Vital Signs BP (!) 149/98 (BP Location: Left Arm)   Pulse 94   Temp 98 F (36.7 C) (Oral)   Resp 18   Ht 1.727 m (5\' 8" )   Wt 77.1 kg   LMP 03/11/2012 (Exact Date)   SpO2 99%   BMI 25.85 kg/m  Physical Exam Vitals and nursing note reviewed.  Constitutional:  General: She is not in acute distress.    Appearance: She is well-developed.  HENT:     Head: Normocephalic and atraumatic.     Right Ear: External ear normal.     Left Ear: External ear normal.  Eyes:     General: No scleral icterus.       Right eye: No discharge.        Left eye: No discharge.     Conjunctiva/sclera: Conjunctivae normal.  Neck:     Trachea: No tracheal deviation.  Cardiovascular:     Rate and Rhythm: Normal rate and regular rhythm.  Pulmonary:     Effort: Pulmonary effort is normal. No respiratory distress.     Breath sounds: Normal breath sounds. No stridor. No wheezing or rales.  Abdominal:     General: Bowel  sounds are normal. There is no distension.     Palpations: Abdomen is soft.     Tenderness: There is no abdominal tenderness. There is no guarding or rebound.  Musculoskeletal:        General: No tenderness or deformity.     Cervical back: Neck supple.  Skin:    General: Skin is warm and dry.     Findings: Petechiae and rash present. Rash is purpuric.     Comments: Petechiae and purpuric rash purpuric lesions also noted in the tongue  Neurological:     General: No focal deficit present.     Mental Status: She is alert.     Cranial Nerves: No cranial nerve deficit, dysarthria or facial asymmetry.     Sensory: No sensory deficit.     Motor: No abnormal muscle tone or seizure activity.     Coordination: Coordination normal.  Psychiatric:        Mood and Affect: Mood normal.     ED Results / Procedures / Treatments   Labs (all labs ordered are listed, but only abnormal results are displayed) Labs Reviewed  COMPREHENSIVE METABOLIC PANEL - Abnormal; Notable for the following components:      Result Value   Glucose, Bld 108 (*)    All other components within normal limits  CBC WITH DIFFERENTIAL/PLATELET - Abnormal; Notable for the following components:   Hemoglobin 15.5 (*)    MCH 34.7 (*)    Platelets <5 (*)    All other components within normal limits  VITAMIN B12  HEPATITIS C ANTIBODY  HIV ANTIBODY (ROUTINE TESTING W REFLEX)  ANA W/REFLEX IF POSITIVE  SEDIMENTATION RATE  CBC WITH DIFFERENTIAL/PLATELET  TECHNOLOGIST SMEAR REVIEW  ABO/RH  TYPE AND SCREEN  PREPARE PLATELET PHERESIS    EKG None  Radiology No results found.  Procedures Procedures    Medications Ordered in ED Medications  0.9 %  sodium chloride infusion (Manually program via Guardrails IV Fluids) (has no administration in time range)  Immune Globulin 10% (PRIVIGEN) IV infusion 65 g (has no administration in time range)  dexamethasone (DECADRON) tablet 40 mg (has no administration in time range)     ED Course/ Medical Decision Making/ A&P Clinical Course as of 03/02/23 1639  Fri Mar 02, 2023  1538 Case reviewed with Dr Bertis Ruddy.  She will see Ms. Earl Lites in consultation.  She will order additional workup for her new onset thrombocytopenia. [JK]  1540   She does recommend platelet transfusion and admission to the hospital for further treatment [JK]    Clinical Course User Index [JK] Linwood Dibbles, MD  Medical Decision Making Problems Addressed: Thrombocytopenia (HCC): acute illness or injury that poses a threat to life or bodily functions  Amount and/or Complexity of Data Reviewed Labs: ordered. Decision-making details documented in ED Course.  Risk Prescription drug management. Decision regarding hospitalization. Risk Details: Case was discussed with Dr. Bertis Ruddy hematology oncology   Patient presented to the ED for evaluation of a purpuric and petechial rash.  Patient recently was on a course of Bactrim.  Patient denies any other fever, vomiting or diarrhea.  She does have evidence of [place] cutaneous involvement of her petechiae and purpura.  Patient's laboratory tests are notable for profound thrombocytopenia.  She does not have any evidence of anemia or leukopenia.  Suspect this may be related to her recent Bactrim use but will need further workup.  Patient will be admitted to the hospital for further evaluation.  I did speak to the patient about platelet transfusion and she agrees.  I have spoken with Dr. Bertis Ruddy which hematology who has personally evaluated the patient.  I have also spoken with hospitalist service for admission.          Final Clinical Impression(s) / ED Diagnoses Final diagnoses:  Thrombocytopenia [facility])    Rx / DC Orders ED Discharge Orders     None         Linwood Dibbles, MD 03/02/23 [phone]

## 2023-03-02 NOTE — Progress Notes (Signed)
Housekeeping just finished preparing the room for a new patient.

## 2023-03-02 NOTE — ED Triage Notes (Signed)
C/o rash to right lower leg and stopped taking abx yesterday due to rash.  Benadryl and ibuprofen yesterday w/o relief.  Boil removed on 4/15 and placed on bactium x 10days.  Also c/o multiple bruising and nose bleeds.  Denies blood thinner usage.

## 2023-03-02 NOTE — ED Notes (Signed)
Receiving RN [facility] has agreed to accept [facility] once pt has arrived to inpatient unit, all questions and concerns address. Pt on the way to unit as we are at the 40 mins mark since pt was assigned inpatient bed

## 2023-03-03 DIAGNOSIS — D696 Thrombocytopenia, unspecified: Secondary | ICD-10-CM | POA: Diagnosis not present

## 2023-03-03 DIAGNOSIS — Z8042 Family history of malignant neoplasm of prostate: Secondary | ICD-10-CM | POA: Diagnosis not present

## 2023-03-03 DIAGNOSIS — Z885 Allergy status to narcotic agent status: Secondary | ICD-10-CM | POA: Diagnosis not present

## 2023-03-03 DIAGNOSIS — L0292 Furuncle, unspecified: Secondary | ICD-10-CM | POA: Diagnosis present

## 2023-03-03 DIAGNOSIS — Z833 Family history of diabetes mellitus: Secondary | ICD-10-CM | POA: Diagnosis not present

## 2023-03-03 DIAGNOSIS — K068 Other specified disorders of gingiva and edentulous alveolar ridge: Secondary | ICD-10-CM | POA: Diagnosis present

## 2023-03-03 DIAGNOSIS — Z803 Family history of malignant neoplasm of breast: Secondary | ICD-10-CM | POA: Diagnosis not present

## 2023-03-03 DIAGNOSIS — Z808 Family history of malignant neoplasm of other organs or systems: Secondary | ICD-10-CM | POA: Diagnosis not present

## 2023-03-03 DIAGNOSIS — Z880 Allergy status to penicillin: Secondary | ICD-10-CM | POA: Diagnosis not present

## 2023-03-03 DIAGNOSIS — D693 Immune thrombocytopenic purpura: Secondary | ICD-10-CM | POA: Diagnosis present

## 2023-03-03 DIAGNOSIS — Z83438 Family history of other disorder of lipoprotein metabolism and other lipidemia: Secondary | ICD-10-CM | POA: Diagnosis not present

## 2023-03-03 DIAGNOSIS — Z79899 Other long term (current) drug therapy: Secondary | ICD-10-CM | POA: Diagnosis not present

## 2023-03-03 DIAGNOSIS — Z8249 Family history of ischemic heart disease and other diseases of the circulatory system: Secondary | ICD-10-CM | POA: Diagnosis not present

## 2023-03-03 LAB — CBC WITH DIFFERENTIAL/PLATELET
Abs Immature Granulocytes: 0.08 10*3/uL — ABNORMAL HIGH (ref 0.00–0.07)
Abs Immature Granulocytes: 0.02 10*3/uL (ref 0.00–0.07)
Basophils Absolute: 0 10*3/uL (ref 0.0–0.1)
Basophils Absolute: 0 10*3/uL (ref 0.0–0.1)
Basophils Relative: 0 %
Basophils Relative: 0 %
Eosinophils Absolute: 0.1 10*3/uL (ref 0.0–0.5)
Eosinophils Absolute: 0 10*3/uL (ref 0.0–0.5)
Eosinophils Relative: 1 %
Eosinophils Relative: 0 %
HCT: 39.1 % (ref 36.0–46.0)
HCT: 42.2 % (ref 36.0–46.0)
Hemoglobin: 13.8 g/dL (ref 12.0–15.0)
Hemoglobin: 14.5 g/dL (ref 12.0–15.0)
Immature Granulocytes: 1 %
Immature Granulocytes: 1 %
Lymphocytes Relative: 37 %
Lymphocytes Relative: 19 %
Lymphs Abs: 2.6 10*3/uL (ref 0.7–4.0)
Lymphs Abs: 0.6 10*3/uL — ABNORMAL LOW (ref 0.7–4.0)
MCH: 34 pg (ref 26.0–34.0)
MCH: 34 pg (ref 26.0–34.0)
MCHC: 35.3 g/dL (ref 30.0–36.0)
MCHC: 34.4 g/dL (ref 30.0–36.0)
MCV: 96.3 fL (ref 80.0–100.0)
MCV: 99.1 fL (ref 80.0–100.0)
Monocytes Absolute: 0.6 10*3/uL (ref 0.1–1.0)
Monocytes Absolute: 0.1 10*3/uL (ref 0.1–1.0)
Monocytes Relative: 8 %
Monocytes Relative: 2 %
Neutro Abs: 3.8 10*3/uL (ref 1.7–7.7)
Neutro Abs: 2.3 10*3/uL (ref 1.7–7.7)
Neutrophils Relative %: 53 %
Neutrophils Relative %: 78 %
Platelets: <5 10*3/uL — CL (ref 150–400)
Platelets: 14 10*3/uL — CL (ref 150–400)
RBC: 4.06 MIL/uL (ref 3.87–5.11)
RBC: 4.26 MIL/uL (ref 3.87–5.11)
RDW: 11.5 % (ref 11.5–15.5)
RDW: 11.5 % (ref 11.5–15.5)
WBC: 7.1 10*3/uL (ref 4.0–10.5)
WBC: 2.9 10*3/uL — ABNORMAL LOW (ref 4.0–10.5)
nRBC: 0 % (ref 0.0–0.2)
nRBC: 0 % (ref 0.0–0.2)

## 2023-03-03 LAB — VITAMIN B12: Vitamin B-12: 817 pg/mL (ref 180–914)

## 2023-03-03 LAB — BASIC METABOLIC PANEL
Anion gap: 10 (ref 5–15)
BUN: 10 mg/dL (ref 8–23)
CO2: 20 mmol/L — ABNORMAL LOW (ref 22–32)
Calcium: 8.9 mg/dL (ref 8.9–10.3)
Chloride: 103 mmol/L (ref 98–111)
Creatinine, Ser: 0.58 mg/dL (ref 0.44–1.00)
GFR, Estimated: >60 mL/min (ref >60)
Glucose, Bld: 148 mg/dL — ABNORMAL HIGH (ref 70–99)
Potassium: 3.4 mmol/L — ABNORMAL LOW (ref 3.5–5.1)
Sodium: 133 mmol/L — ABNORMAL LOW (ref 135–145)

## 2023-03-03 LAB — TECHNOLOGIST SMEAR REVIEW: Plt Morphology: DECREASED

## 2023-03-03 LAB — HEPATITIS C ANTIBODY: HCV Ab: NONREACTIVE

## 2023-03-03 LAB — SEDIMENTATION RATE: Sed Rate: 14 mm/hr (ref 0–22)

## 2023-03-03 LAB — HIV ANTIBODY (ROUTINE TESTING W REFLEX): HIV Screen 4th Generation wRfx: NONREACTIVE

## 2023-03-03 MED ORDER — POTASSIUM CHLORIDE CRYS ER 20 MEQ PO TBCR
40.0000 meq | EXTENDED_RELEASE_TABLET | Freq: Once | ORAL | Status: AC
  Administered 2023-03-03: 40 meq via ORAL
  Filled 2023-03-03: qty 2

## 2023-03-03 MED ORDER — ACETAMINOPHEN 500 MG PO TABS
500.0000 mg | ORAL_TABLET | Freq: Once | ORAL | Status: AC
  Administered 2023-03-03: 500 mg via ORAL
  Filled 2023-03-03: qty 1

## 2023-03-03 MED ORDER — SODIUM CHLORIDE 0.9 % IV SOLN: INTRAVENOUS | Status: AC

## 2023-03-03 NOTE — Plan of Care (Signed)

## 2023-03-03 NOTE — Progress Notes (Signed)
  Progress Note   Patient: Anne Kelly ZOX:[id number] DOB: 1956/03/12 DOA: 03/02/2023     0 DOS: the patient was seen and examined on 03/03/2023   Brief hospital course: 67 year old woman no significant PMH presented with rash on lower extremities, epistaxis, purpura.  Admitted for severe thrombocytopenia, differential including Bactrim versus ITP.  Consultants Hematology  Procedures None  Assessment and Plan: Acute thrombocytopenia Per oncology suspect acute ITP although ADR to Bactrim is in the differential. Platelets up to 14 today.  No signs or symptoms of complications. Continue dexamethasone, IVIG over the weekend. Transfuse for bleeding or platelets less than 10,000. Further management per hematology when returns 4/29. Monitor CBC, platelets daily.     Subjective:  Feels better No new bruising or rash  Physical Exam: Vitals:   03/03/23 0531 03/03/23 0546 03/03/23 0742 03/03/23 1232  BP: 127/86 (!) 139/94 (!) 134/91 124/87  Pulse: 76 79 76 86  Resp:   18 18  Temp:   (!) 97.5 F (36.4 C) 97.7 F (36.5 C)  TempSrc:   Oral Oral  SpO2: 92% 93% 93% 94%  Weight:      Height:       Physical Exam Vitals reviewed.  Constitutional:      General: She is not in acute distress.    Appearance: She is not ill-appearing or toxic-appearing.  Cardiovascular:     Rate and Rhythm: Normal rate and regular rhythm.     Heart sounds: No murmur heard. Pulmonary:     Effort: Pulmonary effort is normal. No respiratory distress.     Breath sounds: No wheezing, rhonchi or rales.  Skin:    Comments: Extensive petechiae bilateral lower extremities.  Some ecchymosis in the mouth noted.  Ecchymosis noted over the upper extremities.  Neurological:     Mental Status: She is alert.  Psychiatric:        Mood and Affect: Mood normal.        Behavior: Behavior normal.     Data Reviewed: K+ 3.4 WBC 2.9 Plts <5, now 14  Family Communication: offered to call family but patient  declined  Disposition: Status is: Observation   Planned Discharge Destination: Home    Time spent: 35 minutes  Author: Brendia Sacks, MD 03/03/2023 2:27 PM  For on call review [email or web address].

## 2023-03-03 NOTE — Hospital Course (Addendum)
67 year old woman no significant PMH presented with rash on lower extremities, epistaxis, purpura.  Admitted for severe thrombocytopenia, differential including Bactrim versus ITP.  Consultants Hematology  Procedures None

## 2023-03-04 DIAGNOSIS — D693 Immune thrombocytopenic purpura: Secondary | ICD-10-CM

## 2023-03-04 DIAGNOSIS — D696 Thrombocytopenia, unspecified: Secondary | ICD-10-CM | POA: Diagnosis not present

## 2023-03-04 LAB — BPAM PLATELET PHERESIS
Blood Product Expiration Date: 202404292359
ISSUE DATE / TIME: 202404261844
Unit Type and Rh: 5100

## 2023-03-04 LAB — CBC WITH DIFFERENTIAL/PLATELET
Abs Immature Granulocytes: 0.04 10*3/uL (ref 0.00–0.07)
Basophils Absolute: 0 10*3/uL (ref 0.0–0.1)
Basophils Relative: 0 %
Eosinophils Absolute: 0 10*3/uL (ref 0.0–0.5)
Eosinophils Relative: 0 %
HCT: 38.8 % (ref 36.0–46.0)
Hemoglobin: 13.7 g/dL (ref 12.0–15.0)
Immature Granulocytes: 1 %
Lymphocytes Relative: 16 %
Lymphs Abs: 1.1 10*3/uL (ref 0.7–4.0)
MCH: 33.9 pg (ref 26.0–34.0)
MCHC: 35.3 g/dL (ref 30.0–36.0)
MCV: 96 fL (ref 80.0–100.0)
Monocytes Absolute: 0.1 10*3/uL (ref 0.1–1.0)
Monocytes Relative: 2 %
Neutro Abs: 5.5 10*3/uL (ref 1.7–7.7)
Neutrophils Relative %: 81 %
Platelets: 52 10*3/uL — ABNORMAL LOW (ref 150–400)
RBC: 4.04 MIL/uL (ref 3.87–5.11)
RDW: 11.2 % — ABNORMAL LOW (ref 11.5–15.5)
WBC: 6.7 10*3/uL (ref 4.0–10.5)
nRBC: 0 % (ref 0.0–0.2)

## 2023-03-04 LAB — PREPARE PLATELET PHERESIS
Unit division: 0
Unit division: 0

## 2023-03-04 NOTE — Progress Notes (Signed)
  Progress Note   Patient: Anne Kelly ZOX:[id number] DOB: [birth year] DOA: 03/02/2023     1 DOS: the patient was seen and examined on 03/04/2023   Brief hospital course: 67 year old woman no significant PMH presented with rash on lower extremities, epistaxis, purpura.  Admitted for severe thrombocytopenia, differential including Bactrim versus ITP.  Consultants Hematology  Procedures None  Assessment and Plan: Acute thrombocytopenia Per oncology suspect acute ITP although ADR to Bactrim is in the differential. Platelets up to 52 today.  No signs or symptoms of complications. Continue dexamethasone, IVIG over the weekend. Transfuse for bleeding or platelets less than 10,000. Further management per hematology when returns 4/29. Monitor CBC, platelets daily.    Subjective:  Had a HA yesterday, but it is gone today No complaints today  Physical Exam: Vitals:   03/04/23 0157 03/04/23 0655 03/04/23 0937 03/04/23 1219  BP: 127/71 128/81 114/72 126/87  Pulse: 86 78 82 83  Resp: 16 18 16 18   Temp: 98.1 F (36.7 C) 97.8 F (36.6 C) 97.7 F (36.5 C) 97.6 F (36.4 C)  TempSrc: Oral Oral Oral Oral  SpO2: 98% 95% 98% 98%  Weight:      Height:       Physical Exam Vitals reviewed.  Constitutional:      General: She is not in acute distress.    Appearance: She is not ill-appearing or toxic-appearing.  Cardiovascular:     Rate and Rhythm: Normal rate and regular rhythm.     Heart sounds: No murmur heard. Pulmonary:     Effort: Pulmonary effort is normal. No respiratory distress.     Breath sounds: No wheezing, rhonchi or rales.  Skin:    Comments: Petechiae noted again over BLE. No new lesions in mouth.  Neurological:     Mental Status: She is alert.  Psychiatric:        Mood and Affect: Mood normal.        Behavior: Behavior normal.     Data Reviewed: Plts up to 52  Family Communication: none requested  Disposition: Status is: Inpatient Remains inpatient  appropriate because: severe thrombocytopenia  Planned Discharge Destination: Home    Time spent: 20 minutes  Author: Brendia Sacks, MD 03/04/2023 1:09 PM  For on call review [email or web address].

## 2023-03-04 NOTE — TOC CM/SW Note (Signed)
  Transition of Care [facility]) Screening Note   Patient Details  Name: Anne Kelly Date of Birth: 1955-11-24   Transition of Care [facility]) CM/SW Contact:    Amada Jupiter, LCSW Phone Number: 03/04/2023, 11:20 AM    Transition of Care Department [facility]) has reviewed patient and no TOC needs have been identified at this time. We will continue to monitor patient advancement through interdisciplinary progression rounds. If new patient transition needs arise, please place a TOC consult.

## 2023-03-05 ENCOUNTER — Inpatient Hospital Stay (HOSPITAL_COMMUNITY): Payer: BC Managed Care – PPO

## 2023-03-05 ENCOUNTER — Encounter (HOSPITAL_COMMUNITY): Payer: Self-pay | Admitting: Family Medicine

## 2023-03-05 DIAGNOSIS — D696 Thrombocytopenia, unspecified: Secondary | ICD-10-CM | POA: Diagnosis not present

## 2023-03-05 LAB — CBC WITH DIFFERENTIAL/PLATELET
Abs Immature Granulocytes: 0.02 10*3/uL (ref 0.00–0.07)
Basophils Absolute: 0 10*3/uL (ref 0.0–0.1)
Basophils Relative: 1 %
Eosinophils Absolute: 0 10*3/uL (ref 0.0–0.5)
Eosinophils Relative: 0 %
HCT: 38.7 % (ref 36.0–46.0)
Hemoglobin: 13.5 g/dL (ref 12.0–15.0)
Immature Granulocytes: 0 %
Lymphocytes Relative: 40 %
Lymphs Abs: 2.9 10*3/uL (ref 0.7–4.0)
MCH: 34.1 pg — ABNORMAL HIGH (ref 26.0–34.0)
MCHC: 34.9 g/dL (ref 30.0–36.0)
MCV: 97.7 fL (ref 80.0–100.0)
Monocytes Absolute: 0.5 10*3/uL (ref 0.1–1.0)
Monocytes Relative: 7 %
Neutro Abs: 3.8 10*3/uL (ref 1.7–7.7)
Neutrophils Relative %: 52 %
Platelets: 92 10*3/uL — ABNORMAL LOW (ref 150–400)
RBC: 3.96 MIL/uL (ref 3.87–5.11)
RDW: 11.2 % — ABNORMAL LOW (ref 11.5–15.5)
WBC: 7.3 10*3/uL (ref 4.0–10.5)
nRBC: 0 % (ref 0.0–0.2)

## 2023-03-05 LAB — ANA W/REFLEX IF POSITIVE: Anti Nuclear Antibody (ANA): NEGATIVE

## 2023-03-05 MED ORDER — DEXAMETHASONE 4 MG PO TABS
40.0000 mg | ORAL_TABLET | Freq: Once | ORAL | Status: AC
  Administered 2023-03-05: 40 mg via ORAL
  Filled 2023-03-05: qty 10

## 2023-03-05 NOTE — Discharge Summary (Signed)
Physician Discharge Summary   Patient: Anne Kelly MRN: [id number] DOB: December 26, 1955  Admit date:     03/02/2023  Discharge date: 03/05/23  Discharge Physician: Brendia Sacks   PCP: Sigmund Hazel, MD   Recommendations at discharge:    Follow-up ITP vs Bactrim-induced thrombocytopenia  Discharge Diagnoses: Principal Problem:   Thrombocytopenia (HCC) Active Problems:   Acute ITP (HCC)  Resolved Problems:   * No resolved hospital problems. *  Hospital Course: 67 year old woman no significant PMH presented with rash on lower extremities, epistaxis, purpura.  Admitted for severe thrombocytopenia, differential including Bactrim versus ITP.  Seen by hematology.  Treated with IVIG and high-dose steroids with rapid improvement.  Discharged home in good condition with outpatient follow-up.  Consultants Hematology  Procedures None  Acute thrombocytopenia Per oncology suspect acute ITP although ADR to Bactrim is in the differential. Platelets up to 92 today.  No signs or symptoms of complications. Complete dexamethasone today.  Completed IVIG over the weekend. Further management per hematology 5/2      Disposition: Home Diet recommendation:  Regular diet DISCHARGE MEDICATION: Allergies as of 03/05/2023       Reactions   Amoxicillin Hives, Shortness Of Breath   Bactrim [sulfamethoxazole-trimethoprim] Other (See Comments)   Life-threatening thrombocytopenia   Codeine Nausea And Vomiting   Penicillins Itching, Rash        Medication List     STOP taking these medications    Ibuprofen 200 MG Caps   sulfamethoxazole-trimethoprim 800-160 MG tablet Commonly known as: BACTRIM DS       TAKE these medications    CHOLEST OFF PO Take 2 capsules by mouth See admin instructions. CholestOff Plus Softgels- Take 2 softgels by mouth every other morning   COLLAGEN PLUS VITAMIN C PO Take 2 tablets by mouth See admin instructions. Take 2 tablets by mouth every other  morning   diphenhydrAMINE 25 MG tablet Commonly known as: BENADRYL Take 25 mg by mouth every 6 (six) hours as needed (for suspected allergic reactions or rashes).   ELDERBERRY PO Take 2 tablets by mouth See admin instructions. Elderberry with zinc- Chew 2 gummies by mouth once a day   Fish Oil 1000 MG Caps Take 1,000 mg by mouth daily.   ONE-A-DAY WOMENS PO Take 1 tablet by mouth daily with breakfast.   PROBIOTIC PO Take 1 capsule by mouth in the morning.   RESVERATROL PO Take 1 capsule by mouth daily.   TYLENOL 500 MG tablet Generic drug: acetaminophen Take 500-1,000 mg by mouth every 6 (six) hours as needed for mild pain or headache.   Vitamin D3 1000 units Caps Take 1,000 Units by mouth daily.        Follow-up Information     Sigmund Hazel, MD Follow up.   Specialty: Family Medicine Why: As needed Contact information: [address] [place] [address] [phone]                Discharge Exam: Ceasar Mons Weights   03/02/23 1310 03/03/23 0007  Weight: 77.1 kg 79.8 kg   Physical Exam Vitals reviewed.  Constitutional:      General: She is not in acute distress.    Appearance: She is not ill-appearing or toxic-appearing.  Cardiovascular:     Rate and Rhythm: Normal rate and regular rhythm.     Heart sounds: No murmur heard. Pulmonary:     Effort: Pulmonary effort is normal. No respiratory distress.     Breath sounds: No wheezing, rhonchi or rales.  Neurological:     Mental Status: She is alert.  Psychiatric:        Mood and Affect: Mood normal.        Behavior: Behavior normal.      Condition at discharge: good  The results of significant diagnostics from this hospitalization (including imaging, microbiology, ancillary and laboratory) are listed below for reference.   Imaging Studies: CT HEAD WO CONTRAST ( )  Result Date: 03/05/2023 CLINICAL DATA:  Headache.  Severe thrombocytopenia. EXAM: CT HEAD WITHOUT CONTRAST TECHNIQUE:  Contiguous axial images were obtained from the base of the skull through the vertex without intravenous contrast. RADIATION DOSE REDUCTION: This exam was performed according to the departmental dose-optimization program which includes automated exposure control, adjustment of the mA and/or kV according to patient size and/or use of iterative reconstruction technique. COMPARISON:  Head CT 11/14/2017. FINDINGS: Brain: No acute intracranial hemorrhage. Gray-white differentiation is preserved. No hydrocephalus or extra-axial collection. No mass effect or midline shift. Vascular: No hyperdense vessel or unexpected calcification. Skull: No calvarial fracture or suspicious bone lesion. Skull base is unremarkable. Sinuses/Orbits: Unremarkable. Other: None. IMPRESSION: No acute intracranial abnormality. Electronically Signed   By: Orvan Falconer M.D.   On: 03/05/2023 14:37    Microbiology: Results for orders placed or performed in visit on 02/21/16  Urine culture     Status: None   Collection Time: 02/21/16  9:24 AM   Specimen: Urine  Result Value Ref Range Status   Colony Count 65,000 COLONIES/ML  Final   Organism ID, Bacteria Multiple bacterial morphotypes present, none  Final   Organism ID, Bacteria predominant. Suggest appropriate recollection if  Final   Organism ID, Bacteria clinically indicated.  Final    Labs: CBC: Recent Labs  Lab 03/02/23 1413 03/03/23 0002 03/03/23 0802 03/04/23 0624 03/05/23 0608  WBC 6.8 7.1 2.9* 6.7 7.3  NEUTROABS 4.0 3.8 2.3 5.5 3.8  HGB 15.5* 13.8 14.5 13.7 13.5  HCT 44.2 39.1 42.2 38.8 38.7  MCV 98.9 96.3 99.1 96.0 97.7  PLT <5* <5* 14* 52* 92*   Basic Metabolic Panel: Recent Labs  Lab 03/02/23 1413 03/03/23 0802  NA 137 133*  K 4.1 3.4*  CL 103 103  CO2 22 20*  GLUCOSE 108* 148*  BUN 12 10  CREATININE 0.62 0.58  CALCIUM 9.2 8.9   Liver Function Tests: Recent Labs  Lab 03/02/23 1413  AST 26  ALT 29  ALKPHOS 74  BILITOT 0.8  PROT 7.5   ALBUMIN 4.5   CBG: No results for input(s): "GLUCAP" in the last 168 hours.  Discharge time spent: less than 30 minutes.  Signed: Brendia Sacks, MD [facility] 03/05/2023

## 2023-03-05 NOTE — Progress Notes (Signed)
Mobility Specialist - Progress Note   03/05/23 1110  Mobility  Activity Ambulated independently in hallway  Level of Assistance Independent  Assistive Device None  Distance Ambulated (ft) 1000 ft  Activity Response Tolerated well  Mobility Referral Yes  $Mobility charge 1 Mobility   Pt received in bed and agreeable to mobility. No complaints during session. Pt to room standing after session with all needs met.    [facility]

## 2023-03-05 NOTE — Progress Notes (Signed)
Patient being discharged home. All discharge instructions reviewed with patient and husband.  Both verbalized a full understanding. Patient's husband is ride home.

## 2023-03-05 NOTE — Progress Notes (Signed)
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 ITP although her history is suggestive of "reaction" to Bactrim She had received platelet transfusions, IVIG and dexamethasone Her platelet count is trending up She needs 1 more dose of dexamethasone prior to discharge I will set up outpatient follow-up on Thursday We discussed natural history of ITP, the success rate of " cure" versus risk of relapse  Intermittent headaches Could be due to rebound from caffeine withdrawal versus side effects of steroids Continue acetaminophen as needed  Discharge planning She can be discharged today after last dose of dexamethasone  I discussed this with her daughter over the phone   All questions were answered. The patient knows to call the clinic with any problems, questions or concerns.   The total time spent in the appointment was 30 minutes encounter with patients including review of chart and various tests results, discussions about plan of care and coordination of care plan  Artis Delay, MD 03/05/2023 8:14 AM  Subjective:  She complained of headache this morning.  She has minimum sleep over the weekend.  She had some nausea vomiting with dexamethasone.  No further bleeding over the past 24 hours  Objective:  Vitals:   03/04/23 1956 03/05/23 0553  BP: (!) 158/96 (!) 149/95  Pulse: 73 72  Resp: 18 18  Temp: 98.2 F (36.8 C) 98.1 F (36.7 C)  SpO2: 99% 97%     Intake/Output Summary (Last 24 hours) at 03/05/2023 0814 Last data filed at 03/04/2023 2200 Gross per 24 hour  Intake 480 ml  Output --  Net 480 ml    GENERAL:alert, no distress and comfortable SKIN: She has diffuse petechial rash, unchanged compared to previous exam NEURO: alert & oriented x 3 with fluent speech, no focal motor/sensory deficits   Labs:  Recent Labs    03/02/23 1413 03/03/23 0802  NA 137 133*  K 4.1 3.4*  CL 103 103  CO2 22 20*  GLUCOSE 108* 148*  BUN  12 10  CREATININE 0.62 0.58  CALCIUM 9.2 8.9  GFRNONAA >60 >60  PROT 7.5  --   ALBUMIN 4.5  --   AST 26  --   ALT 29  --   ALKPHOS 74  --   BILITOT 0.8  --

## 2023-03-07 ENCOUNTER — Other Ambulatory Visit: Payer: Self-pay | Admitting: Hematology and Oncology

## 2023-03-07 DIAGNOSIS — D693 Immune thrombocytopenic purpura: Secondary | ICD-10-CM

## 2023-03-08 ENCOUNTER — Encounter: Payer: Self-pay | Admitting: Hematology and Oncology

## 2023-03-08 ENCOUNTER — Inpatient Hospital Stay (HOSPITAL_BASED_OUTPATIENT_CLINIC_OR_DEPARTMENT_OTHER): Payer: BC Managed Care – PPO | Admitting: Hematology and Oncology

## 2023-03-08 ENCOUNTER — Inpatient Hospital Stay: Payer: BC Managed Care – PPO | Attending: Hematology and Oncology

## 2023-03-08 VITALS — BP 124/78 | HR 71 | Temp 98.5°F | Resp 18 | Ht 68.5 in | Wt 173.2 lb

## 2023-03-08 DIAGNOSIS — D693 Immune thrombocytopenic purpura: Secondary | ICD-10-CM | POA: Diagnosis not present

## 2023-03-08 DIAGNOSIS — Z885 Allergy status to narcotic agent status: Secondary | ICD-10-CM | POA: Diagnosis not present

## 2023-03-08 DIAGNOSIS — R21 Rash and other nonspecific skin eruption: Secondary | ICD-10-CM | POA: Insufficient documentation

## 2023-03-08 DIAGNOSIS — Z881 Allergy status to other antibiotic agents status: Secondary | ICD-10-CM | POA: Insufficient documentation

## 2023-03-08 DIAGNOSIS — Z880 Allergy status to penicillin: Secondary | ICD-10-CM | POA: Diagnosis not present

## 2023-03-08 LAB — CBC WITH DIFFERENTIAL/PLATELET
Abs Immature Granulocytes: 0.02 10*3/uL (ref 0.00–0.07)
Basophils Absolute: 0 10*3/uL (ref 0.0–0.1)
Basophils Relative: 1 %
Eosinophils Absolute: 0.1 10*3/uL (ref 0.0–0.5)
Eosinophils Relative: 2 %
HCT: 42 % (ref 36.0–46.0)
Hemoglobin: 15 g/dL (ref 12.0–15.0)
Immature Granulocytes: 0 %
Lymphocytes Relative: 52 %
Lymphs Abs: 2.7 10*3/uL (ref 0.7–4.0)
MCH: 34.2 pg — ABNORMAL HIGH (ref 26.0–34.0)
MCHC: 35.7 g/dL (ref 30.0–36.0)
MCV: 95.9 fL (ref 80.0–100.0)
Monocytes Absolute: 0.6 10*3/uL (ref 0.1–1.0)
Monocytes Relative: 11 %
Neutro Abs: 1.8 10*3/uL (ref 1.7–7.7)
Neutrophils Relative %: 34 %
Platelets: 282 10*3/uL (ref 150–400)
RBC: 4.38 MIL/uL (ref 3.87–5.11)
RDW: 11.2 % — ABNORMAL LOW (ref 11.5–15.5)
WBC: 5.2 10*3/uL (ref 4.0–10.5)
nRBC: 0 % (ref 0.0–0.2)

## 2023-03-08 NOTE — Assessment & Plan Note (Signed)
She has complete normalization of platelet count after steroids and IVIG treatment We discussed future follow-up She is aware to watch out for signs and symptoms of bleeding or petechiae rash She will return here weekly for CBC check for the next 2 weeks and I plan to see her again in 3 weeks for further follow-up

## 2023-03-08 NOTE — Progress Notes (Signed)
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   The total time spent in the appointment was 20 minutes encounter with patients including review of chart and various tests results, discussions about plan of care and coordination of care plan   All questions were answered. The patient knows to call the clinic with any problems, questions or concerns. No barriers to learning was detected.    Artis Delay, MD 5/2/20249:07 AM  INTERVAL HISTORY: Anne Kelly 67 y.o. 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 She is here accompanied by her husband She has been doing well since discharge from the hospital Her bruises are healing She denies further bleeding  SUMMARY OF HEMATOLOGIC HISTORY: Please see my original consult note dated 03/02/2023 for further details She was found to have abnormal CBC and diffuse petechiae rash She was seen by her dermatologist due to a boil on her back She was prescribed Bactrim on 4/15 She has mild diffuse bone aches and rash 2 days ago This morning, she noted gum bleeding and bruising When she went back to the dermatology office, she was directed to the ER immediately She denies spontaneous epistaxis, hematuria, melena or hematochezia   The patient denies history of liver disease, exposure to heparin, history of cardiac murmur/prior cardiovascular surgery or recent new medications She denies prior blood or platelet transfusions No recent weight loss, fever or  chills She received IVIG on 4/26 and 4/27 along with high-dose dexamethasone.  She received platelet transfusions  I have reviewed the past medical history, past surgical history, social history and family history with the patient and they are unchanged from previous note.  ALLERGIES:  is allergic to amoxicillin, bactrim [sulfamethoxazole-trimethoprim], codeine, and penicillins.  MEDICATIONS:  Current Outpatient Medications  Medication Sig Dispense Refill   Cholecalciferol (VITAMIN D3) 1000 units CAPS Take 1,000 Units by mouth daily.     Collagen-Vitamin C (COLLAGEN PLUS VITAMIN C PO) Take 2 tablets by mouth See admin instructions. Take 2 tablets by mouth every other morning     diphenhydrAMINE (BENADRYL) 25 MG tablet Take 25 mg by mouth every 6 (six) hours as needed (for suspected allergic reactions or rashes).     ELDERBERRY PO Take 2 tablets by mouth See admin instructions. Elderberry with zinc- Chew 2 gummies by mouth once a day     Multiple Vitamins-Minerals (ONE-A-DAY WOMENS PO) Take 1 tablet by mouth daily with breakfast.     Plant Sterols and Stanols (CHOLEST OFF PO) Take 2 capsules by mouth See admin instructions. CholestOff Plus Softgels- Take 2 softgels by mouth every other morning     Probiotic Product (PROBIOTIC PO) Take 1 capsule by mouth in the morning.     RESVERATROL PO Take 1 capsule by mouth daily.     TYLENOL 500 MG tablet Take 500-1,000 mg by mouth every 6 (six) hours as needed for mild pain or headache.     No current facility-administered medications for this visit.  REVIEW OF SYSTEMS:   Constitutional: Denies fevers, chills or night sweats Eyes: Denies blurriness of vision Ears, nose, mouth, throat, and face: Denies mucositis or sore throat Respiratory: Denies cough, dyspnea or wheezes Cardiovascular: Denies palpitation, chest discomfort or lower extremity swelling Gastrointestinal:  Denies nausea, heartburn or change in bowel habits Skin: Denies abnormal  skin rashes Lymphatics: Denies new lymphadenopathy or easy bruising Neurological:Denies numbness, tingling or new weaknesses Behavioral/Psych: Mood is stable, no new changes  All other systems were reviewed with the patient and are negative.  PHYSICAL EXAMINATION: ECOG PERFORMANCE STATUS: 0 - Asymptomatic  Vitals:   03/08/23 0822  BP: 124/78  Pulse: 71  Resp: 18  Temp: 98.5 F (36.9 C)  SpO2: 99%   Filed Weights   03/08/23 0822  Weight: 173 lb 3.2 oz (78.6 kg)    GENERAL:alert, no distress and comfortable SKIN: Petechiae rash at various stages of healing NEURO: alert & oriented x 3 with fluent speech, no focal motor/sensory deficits  LABORATORY DATA:  I have reviewed the data as listed     Component Value Date/Time   NA 133 (L) 03/03/2023 0802   NA 141 09/15/2019 0916   K 3.4 (L) 03/03/2023 0802   CL 103 03/03/2023 0802   CO2 20 (L) 03/03/2023 0802   GLUCOSE 148 (H) 03/03/2023 0802   BUN 10 03/03/2023 0802   BUN 9 09/15/2019 0916   CREATININE 0.58 03/03/2023 0802   CREATININE 0.75 02/27/2017 0952   CALCIUM 8.9 03/03/2023 0802   PROT 7.5 03/02/2023 1413   PROT 6.8 09/15/2019 0916   ALBUMIN 4.5 03/02/2023 1413   ALBUMIN 4.5 09/15/2019 0916   AST 26 03/02/2023 1413   ALT 29 03/02/2023 1413   ALKPHOS 74 03/02/2023 1413   BILITOT 0.8 03/02/2023 1413   BILITOT 0.5 09/15/2019 0916   GFRNONAA >60 03/03/2023 0802   GFRAA 91 09/15/2019 0916    No results found for: "SPEP", "UPEP"  Lab Results  Component Value Date   WBC 5.2 03/08/2023   NEUTROABS 1.8 03/08/2023   HGB 15.0 03/08/2023   HCT 42.0 03/08/2023   MCV 95.9 03/08/2023   PLT 282 03/08/2023      Chemistry      Component Value Date/Time   NA 133 (L) 03/03/2023 0802   NA 141 09/15/2019 0916   K 3.4 (L) 03/03/2023 0802   CL 103 03/03/2023 0802   CO2 20 (L) 03/03/2023 0802   BUN 10 03/03/2023 0802   BUN 9 09/15/2019 0916   CREATININE 0.58 03/03/2023 0802   CREATININE 0.75 02/27/2017 0952       Component Value Date/Time   CALCIUM 8.9 03/03/2023 0802   ALKPHOS 74 03/02/2023 1413   AST 26 03/02/2023 1413   ALT 29 03/02/2023 1413   BILITOT 0.8 03/02/2023 1413   BILITOT 0.5 09/15/2019 0916

## 2023-03-09 ENCOUNTER — Telehealth: Payer: Self-pay

## 2023-03-09 NOTE — Telephone Encounter (Signed)
I typically recommend tylenol pm for this kind of transient pain or Just add 50 mg benadryl to extra strength tylenol at bedtime She can also try melatonin

## 2023-03-09 NOTE — Telephone Encounter (Signed)
She called and left a message. She did not sleep last night due to right upper back pain that is throbbing. She took tylenol with no relief. Her leg pain is better. She is working today and is worried about not sleeping again tonight. She is asking if you can send her a Rx to pharmacy that she could take at night? Or any other suggestions?

## 2023-03-09 NOTE — Telephone Encounter (Signed)
Called and given below message. She verbalized understanding and appreciated the call. She will call the office back for questions/ concerns. 

## 2023-03-15 ENCOUNTER — Telehealth: Payer: Self-pay

## 2023-03-15 ENCOUNTER — Other Ambulatory Visit: Payer: Self-pay

## 2023-03-15 ENCOUNTER — Inpatient Hospital Stay: Payer: BC Managed Care – PPO

## 2023-03-15 DIAGNOSIS — Z881 Allergy status to other antibiotic agents status: Secondary | ICD-10-CM | POA: Diagnosis not present

## 2023-03-15 DIAGNOSIS — D693 Immune thrombocytopenic purpura: Secondary | ICD-10-CM | POA: Diagnosis not present

## 2023-03-15 DIAGNOSIS — Z880 Allergy status to penicillin: Secondary | ICD-10-CM | POA: Diagnosis not present

## 2023-03-15 DIAGNOSIS — R21 Rash and other nonspecific skin eruption: Secondary | ICD-10-CM | POA: Diagnosis not present

## 2023-03-15 DIAGNOSIS — Z885 Allergy status to narcotic agent status: Secondary | ICD-10-CM | POA: Diagnosis not present

## 2023-03-15 LAB — CBC WITH DIFFERENTIAL/PLATELET
Abs Immature Granulocytes: 0.01 10*3/uL (ref 0.00–0.07)
Basophils Absolute: 0 10*3/uL (ref 0.0–0.1)
Basophils Relative: 1 %
Eosinophils Absolute: 0 10*3/uL (ref 0.0–0.5)
Eosinophils Relative: 1 %
HCT: 41.3 % (ref 36.0–46.0)
Hemoglobin: 14.3 g/dL (ref 12.0–15.0)
Immature Granulocytes: 0 %
Lymphocytes Relative: 42 %
Lymphs Abs: 2.6 10*3/uL (ref 0.7–4.0)
MCH: 34.1 pg — ABNORMAL HIGH (ref 26.0–34.0)
MCHC: 34.6 g/dL (ref 30.0–36.0)
MCV: 98.6 fL (ref 80.0–100.0)
Monocytes Absolute: 0.5 10*3/uL (ref 0.1–1.0)
Monocytes Relative: 8 %
Neutro Abs: 3.1 10*3/uL (ref 1.7–7.7)
Neutrophils Relative %: 48 %
Platelets: 313 10*3/uL (ref 150–400)
RBC: 4.19 MIL/uL (ref 3.87–5.11)
RDW: 11 % — ABNORMAL LOW (ref 11.5–15.5)
WBC: 6.2 10*3/uL (ref 4.0–10.5)
nRBC: 0 % (ref 0.0–0.2)

## 2023-03-15 NOTE — Telephone Encounter (Signed)
Called and given platelet results today of 313 and reminded her of appt next week. She verbalized understanding.  She is still c/o of the right upper back pain and taking tylenol. It is getting better, she thinks it may be from laying in the hospital bed. She will try heating pad and continue tylenol prn. The pain seems to be only when she is laying down in bed. She is sleeping a little better after starting the melatonin. She will call the office back for questions/ concerns.

## 2023-03-22 ENCOUNTER — Inpatient Hospital Stay: Payer: BC Managed Care – PPO

## 2023-03-22 DIAGNOSIS — Z881 Allergy status to other antibiotic agents status: Secondary | ICD-10-CM | POA: Diagnosis not present

## 2023-03-22 DIAGNOSIS — D693 Immune thrombocytopenic purpura: Secondary | ICD-10-CM

## 2023-03-22 DIAGNOSIS — R21 Rash and other nonspecific skin eruption: Secondary | ICD-10-CM | POA: Diagnosis not present

## 2023-03-22 DIAGNOSIS — Z885 Allergy status to narcotic agent status: Secondary | ICD-10-CM | POA: Diagnosis not present

## 2023-03-22 DIAGNOSIS — Z880 Allergy status to penicillin: Secondary | ICD-10-CM | POA: Diagnosis not present

## 2023-03-22 LAB — CBC WITH DIFFERENTIAL/PLATELET
Abs Immature Granulocytes: 0.01 10*3/uL (ref 0.00–0.07)
Basophils Absolute: 0 10*3/uL (ref 0.0–0.1)
Basophils Relative: 0 %
Eosinophils Absolute: 0.1 10*3/uL (ref 0.0–0.5)
Eosinophils Relative: 1 %
HCT: 41.9 % (ref 36.0–46.0)
Hemoglobin: 14.2 g/dL (ref 12.0–15.0)
Immature Granulocytes: 0 %
Lymphocytes Relative: 41 %
Lymphs Abs: 1.8 10*3/uL (ref 0.7–4.0)
MCH: 33.6 pg (ref 26.0–34.0)
MCHC: 33.9 g/dL (ref 30.0–36.0)
MCV: 99.3 fL (ref 80.0–100.0)
Monocytes Absolute: 0.4 10*3/uL (ref 0.1–1.0)
Monocytes Relative: 8 %
Neutro Abs: 2.2 10*3/uL (ref 1.7–7.7)
Neutrophils Relative %: 50 %
Platelets: 212 10*3/uL (ref 150–400)
RBC: 4.22 MIL/uL (ref 3.87–5.11)
RDW: 11.5 % (ref 11.5–15.5)
WBC: 4.5 10*3/uL (ref 4.0–10.5)
nRBC: 0 % (ref 0.0–0.2)

## 2023-03-27 DIAGNOSIS — H524 Presbyopia: Secondary | ICD-10-CM | POA: Diagnosis not present

## 2023-03-29 ENCOUNTER — Inpatient Hospital Stay: Payer: BC Managed Care – PPO

## 2023-03-29 ENCOUNTER — Encounter: Payer: Self-pay | Admitting: Hematology and Oncology

## 2023-03-29 ENCOUNTER — Inpatient Hospital Stay (HOSPITAL_BASED_OUTPATIENT_CLINIC_OR_DEPARTMENT_OTHER): Payer: BC Managed Care – PPO | Admitting: Hematology and Oncology

## 2023-03-29 VITALS — BP 114/79 | HR 85 | Temp 98.5°F | Resp 18 | Ht 68.5 in | Wt 175.4 lb

## 2023-03-29 DIAGNOSIS — D693 Immune thrombocytopenic purpura: Secondary | ICD-10-CM

## 2023-03-29 DIAGNOSIS — Z885 Allergy status to narcotic agent status: Secondary | ICD-10-CM | POA: Diagnosis not present

## 2023-03-29 DIAGNOSIS — Z880 Allergy status to penicillin: Secondary | ICD-10-CM | POA: Diagnosis not present

## 2023-03-29 DIAGNOSIS — Z881 Allergy status to other antibiotic agents status: Secondary | ICD-10-CM | POA: Diagnosis not present

## 2023-03-29 DIAGNOSIS — R21 Rash and other nonspecific skin eruption: Secondary | ICD-10-CM | POA: Diagnosis not present

## 2023-03-29 LAB — CBC WITH DIFFERENTIAL/PLATELET
Abs Immature Granulocytes: 0 10*3/uL (ref 0.00–0.07)
Basophils Absolute: 0 10*3/uL (ref 0.0–0.1)
Basophils Relative: 0 %
Eosinophils Absolute: 0.1 10*3/uL (ref 0.0–0.5)
Eosinophils Relative: 3 %
HCT: 40.5 % (ref 36.0–46.0)
Hemoglobin: 14.1 g/dL (ref 12.0–15.0)
Immature Granulocytes: 0 %
Lymphocytes Relative: 44 %
Lymphs Abs: 2.1 10*3/uL (ref 0.7–4.0)
MCH: 34.4 pg — ABNORMAL HIGH (ref 26.0–34.0)
MCHC: 34.8 g/dL (ref 30.0–36.0)
MCV: 98.8 fL (ref 80.0–100.0)
Monocytes Absolute: 0.4 10*3/uL (ref 0.1–1.0)
Monocytes Relative: 8 %
Neutro Abs: 2.2 10*3/uL (ref 1.7–7.7)
Neutrophils Relative %: 45 %
Platelets: 175 10*3/uL (ref 150–400)
RBC: 4.1 MIL/uL (ref 3.87–5.11)
RDW: 11.5 % (ref 11.5–15.5)
WBC: 4.9 10*3/uL (ref 4.0–10.5)
nRBC: 0 % (ref 0.0–0.2)

## 2023-03-29 NOTE — Assessment & Plan Note (Signed)
She has no signs of recurrence ITP since her recent admission We discussed risk factors for ITP recurrence such as incidence with severe stress or infection The patient is educated to watch out for signs and symptoms of easy bruising or bleeding There is no need for routine future follow-up I recommend regular follow-up with primary care doctor for health maintenance I addressed all her questions

## 2023-03-29 NOTE — Progress Notes (Signed)
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   The total time spent in the appointment was 20 minutes encounter with patients including review of chart and various tests results, discussions about plan of care and coordination of care plan   All questions were answered. The patient knows to call the clinic with any problems, questions or concerns. No barriers to learning was detected.    Artis Delay, MD 5/23/20248:18 AM  INTERVAL HISTORY: Anne Kelly 67 y.o. female returns for further follow-up for history of acute ITP She is here accompanied by her husband She is doing well Most of her bruises and petechiae have healed She denies recent bleeding  SUMMARY OF HEMATOLOGIC HISTORY:  Please see my original consult note dated 03/02/2023 for further details She was found to have abnormal CBC and diffuse petechiae rash She was seen by her dermatologist due to a boil on her back She was prescribed Bactrim on 4/15 She has mild diffuse bone aches and rash 2 days ago This morning, she noted gum bleeding and bruising When she went back to the dermatology office, she was directed to the ER immediately She denies spontaneous epistaxis, hematuria, melena or hematochezia   The patient denies history of liver disease, exposure to heparin, history of cardiac murmur/prior cardiovascular surgery or recent new medications She denies prior blood or platelet transfusions No recent  weight loss, fever or chills She received IVIG on 4/26 and 4/27 along with high-dose dexamethasone.  She received platelet transfusions   I have reviewed the past medical history, past surgical history, social history and family history with the patient and they are unchanged from previous note.  ALLERGIES:  is allergic to amoxicillin, bactrim [sulfamethoxazole-trimethoprim], codeine, and penicillins.  MEDICATIONS:  Current Outpatient Medications  Medication Sig Dispense Refill   Cholecalciferol (VITAMIN D3) 1000 units CAPS Take 1,000 Units by mouth daily.     Collagen-Vitamin C (COLLAGEN PLUS VITAMIN C PO) Take 2 tablets by mouth See admin instructions. Take 2 tablets by mouth every other morning     diphenhydrAMINE (BENADRYL) 25 MG tablet Take 25 mg by mouth every 6 (six) hours as needed (for suspected allergic reactions or rashes).     ELDERBERRY PO Take 2 tablets by mouth See admin instructions. Elderberry with zinc- Chew 2 gummies by mouth once a day     Multiple Vitamins-Minerals (ONE-A-DAY WOMENS PO) Take 1 tablet by mouth daily with breakfast.     Plant Sterols and Stanols (CHOLEST OFF PO) Take 2 capsules by mouth See admin instructions. CholestOff Plus Softgels- Take 2 softgels by mouth every other morning     Probiotic Product (PROBIOTIC PO) Take 1 capsule by mouth in the morning.     RESVERATROL PO Take 1 capsule by mouth daily.     TYLENOL 500 MG tablet Take 500-1,000 mg by mouth every 6 (six) hours as needed for mild pain or headache.     No current facility-administered medications for this visit.  REVIEW OF SYSTEMS:   Constitutional: Denies fevers, chills or night sweats Eyes: Denies blurriness of vision Ears, nose, mouth, throat, and face: Denies mucositis or sore throat Respiratory: Denies cough, dyspnea or wheezes Cardiovascular: Denies palpitation, chest discomfort or lower extremity swelling Gastrointestinal:  Denies nausea, heartburn or change in bowel  habits Skin: Denies abnormal skin rashes Lymphatics: Denies new lymphadenopathy or easy bruising Neurological:Denies numbness, tingling or new weaknesses Behavioral/Psych: Mood is stable, no new changes  All other systems were reviewed with the patient and are negative.  PHYSICAL EXAMINATION: ECOG PERFORMANCE STATUS: 0 - Asymptomatic  Vitals:   03/29/23 0812  BP: 114/79  Pulse: 85  Resp: 18  Temp: 98.5 F (36.9 C)  SpO2: 98%   Filed Weights   03/29/23 0812  Weight: 175 lb 6.4 oz (79.6 kg)    GENERAL:alert, no distress and comfortable NEURO: alert & oriented x 3 with fluent speech, no focal motor/sensory deficits  LABORATORY DATA:  I have reviewed the data as listed     Component Value Date/Time   NA 133 (L) 03/03/2023 0802   NA 141 09/15/2019 0916   K 3.4 (L) 03/03/2023 0802   CL 103 03/03/2023 0802   CO2 20 (L) 03/03/2023 0802   GLUCOSE 148 (H) 03/03/2023 0802   BUN 10 03/03/2023 0802   BUN 9 09/15/2019 0916   CREATININE 0.58 03/03/2023 0802   CREATININE 0.75 02/27/2017 0952   CALCIUM 8.9 03/03/2023 0802   PROT 7.5 03/02/2023 1413   PROT 6.8 09/15/2019 0916   ALBUMIN 4.5 03/02/2023 1413   ALBUMIN 4.5 09/15/2019 0916   AST 26 03/02/2023 1413   ALT 29 03/02/2023 1413   ALKPHOS 74 03/02/2023 1413   BILITOT 0.8 03/02/2023 1413   BILITOT 0.5 09/15/2019 0916   GFRNONAA >60 03/03/2023 0802   GFRAA 91 09/15/2019 0916    No results found for: "SPEP", "UPEP"  Lab Results  Component Value Date   WBC 4.9 03/29/2023   NEUTROABS 2.2 03/29/2023   HGB 14.1 03/29/2023   HCT 40.5 03/29/2023   MCV 98.8 03/29/2023   PLT 175 03/29/2023      Chemistry      Component Value Date/Time   NA 133 (L) 03/03/2023 0802   NA 141 09/15/2019 0916   K 3.4 (L) 03/03/2023 0802   CL 103 03/03/2023 0802   CO2 20 (L) 03/03/2023 0802   BUN 10 03/03/2023 0802   BUN 9 09/15/2019 0916   CREATININE 0.58 03/03/2023 0802   CREATININE 0.75 02/27/2017 0952      Component Value  Date/Time   CALCIUM 8.9 03/03/2023 0802   ALKPHOS 74 03/02/2023 1413   AST 26 03/02/2023 1413   ALT 29 03/02/2023 1413   BILITOT 0.8 03/02/2023 1413   BILITOT 0.5 09/15/2019 0916

## 2023-07-03 DIAGNOSIS — Z1283 Encounter for screening for malignant neoplasm of skin: Secondary | ICD-10-CM | POA: Diagnosis not present

## 2023-07-03 DIAGNOSIS — D225 Melanocytic nevi of trunk: Secondary | ICD-10-CM | POA: Diagnosis not present

## 2023-07-30 DIAGNOSIS — Z1231 Encounter for screening mammogram for malignant neoplasm of breast: Secondary | ICD-10-CM | POA: Diagnosis not present

## 2023-08-09 ENCOUNTER — Encounter (HOSPITAL_BASED_OUTPATIENT_CLINIC_OR_DEPARTMENT_OTHER): Payer: Self-pay | Admitting: *Deleted

## 2024-08-19 ENCOUNTER — Encounter (HOSPITAL_BASED_OUTPATIENT_CLINIC_OR_DEPARTMENT_OTHER): Payer: Self-pay | Admitting: Obstetrics & Gynecology
